# Patient Record
Sex: Male | Born: 1955 | Race: Black or African American | Hispanic: No | Smoking: Never smoker
Health system: Southern US, Community
[De-identification: ages and names within clinical notes are randomized; demographics above are authoritative.]

## PROBLEM LIST (undated history)

## (undated) DIAGNOSIS — N429 Disorder of prostate, unspecified: Secondary | ICD-10-CM

## (undated) DIAGNOSIS — E119 Type 2 diabetes mellitus without complications: Secondary | ICD-10-CM

## (undated) DIAGNOSIS — I10 Essential (primary) hypertension: Secondary | ICD-10-CM

## (undated) HISTORY — DX: Disorder of prostate, unspecified: N42.9

---

## 2010-05-13 ENCOUNTER — Emergency Department: Payer: Self-pay | Admitting: Unknown Physician Specialty

## 2012-05-04 ENCOUNTER — Emergency Department: Payer: Self-pay | Admitting: Emergency Medicine

## 2012-05-04 LAB — CBC WITH DIFFERENTIAL/PLATELET
Eosinophil %: 3.9 %
HCT: 41.4 % (ref 40.0–52.0)
HGB: 13.7 g/dL (ref 13.0–18.0)
Lymphocyte #: 2.8 10*3/uL (ref 1.0–3.6)
Lymphocyte %: 35.5 %
MCH: 30.7 pg (ref 26.0–34.0)
MCHC: 33.1 g/dL (ref 32.0–36.0)
Monocyte #: 0.6 x10 3/mm (ref 0.2–1.0)
Monocyte %: 7.7 %
Neutrophil #: 4 10*3/uL (ref 1.4–6.5)
Neutrophil %: 51.9 %
Platelet: 372 10*3/uL (ref 150–440)
RBC: 4.46 10*6/uL (ref 4.40–5.90)

## 2012-05-04 LAB — URINALYSIS, COMPLETE
Bilirubin,UR: NEGATIVE
Glucose,UR: NEGATIVE mg/dL (ref 0–75)
Ketone: NEGATIVE
Nitrite: NEGATIVE
Ph: 6 (ref 4.5–8.0)
RBC,UR: 2 /HPF (ref 0–5)
Specific Gravity: 1.003 (ref 1.003–1.030)
Squamous Epithelial: <1
WBC UR: 3 /HPF (ref 0–5)

## 2012-05-04 LAB — COMPREHENSIVE METABOLIC PANEL
Albumin: 3.9 g/dL (ref 3.4–5.0)
Alkaline Phosphatase: 109 U/L (ref 50–136)
BUN: 9 mg/dL (ref 7–18)
Bilirubin,Total: 0.2 mg/dL (ref 0.2–1.0)
Calcium, Total: 9.1 mg/dL (ref 8.5–10.1)
Chloride: 106 mmol/L (ref 98–107)
Co2: 22 mmol/L (ref 21–32)
Creatinine: 1.09 mg/dL (ref 0.60–1.30)
EGFR (African American): >60
EGFR (Non-African Amer.): >60
Osmolality: 279 (ref 275–301)
Potassium: 3.8 mmol/L (ref 3.5–5.1)
Total Protein: 8.6 g/dL — ABNORMAL HIGH (ref 6.4–8.2)

## 2012-05-05 LAB — ETHANOL: Ethanol %: 0.254 % — ABNORMAL HIGH (ref 0.000–0.080)

## 2014-06-03 IMAGING — CT CT STONE STUDY
1 of 2 series · 14 of 32 positions shown, 18 images · non-contrast
Comparison: none

REASON FOR EXAM: bilateral flank pain, hematuria
COMMENTS:

[Series 2: 3mm soft tissue · axial · 0.71mm/px · z∈[-888,-514]mm · 14 of 142 slices shown, 18 images]
[im 11/142  soft-tissue]
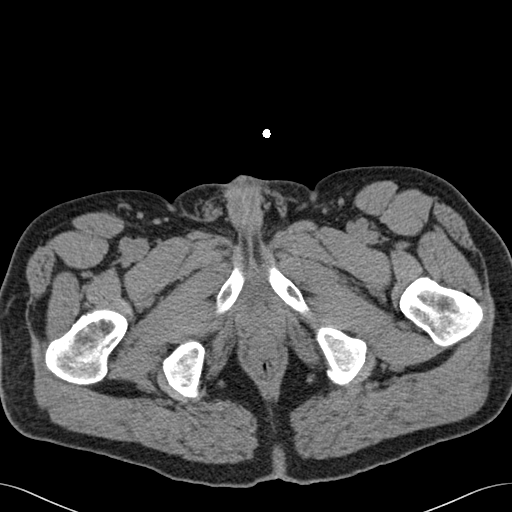
[im 11/142  bone]
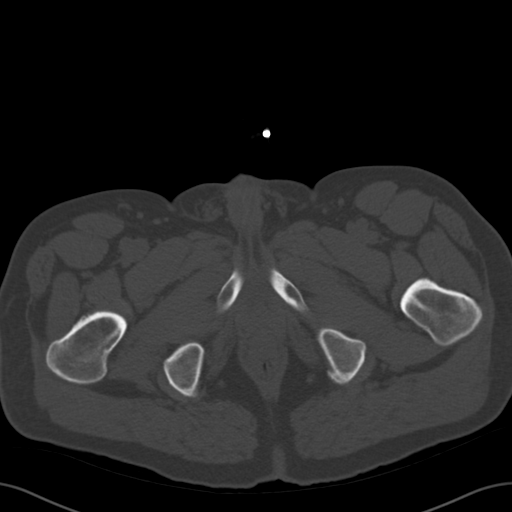
[im 22/142  soft-tissue]
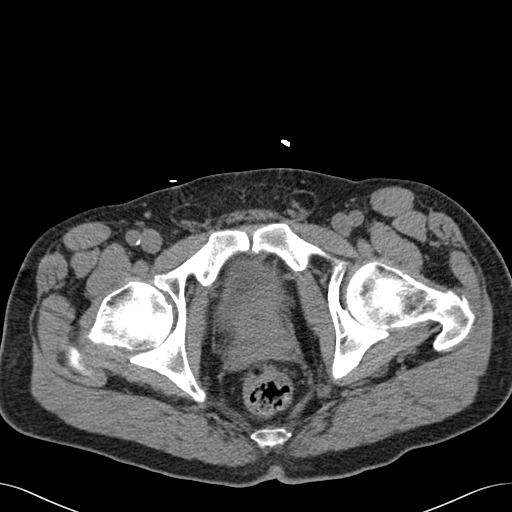
[im 33/142  soft-tissue]
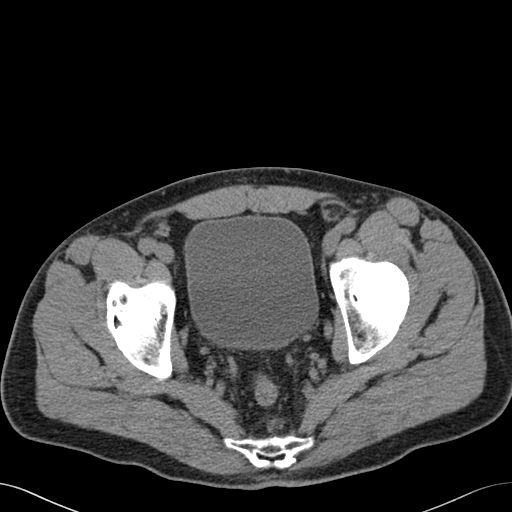
[im 44/142  soft-tissue]
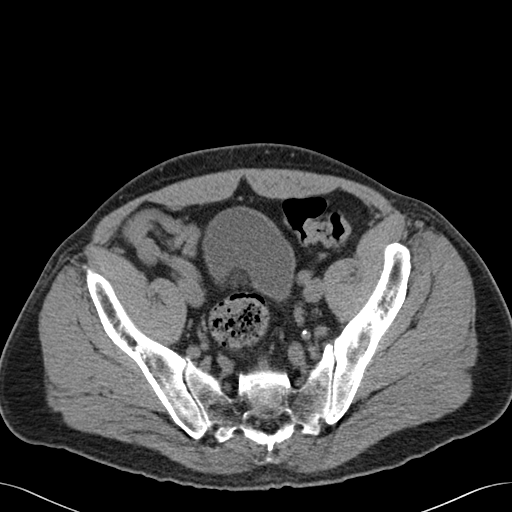
[im 55/142  soft-tissue]
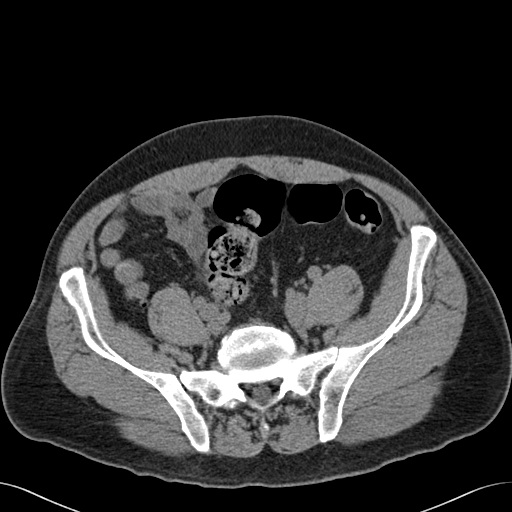
[im 66/142  soft-tissue]
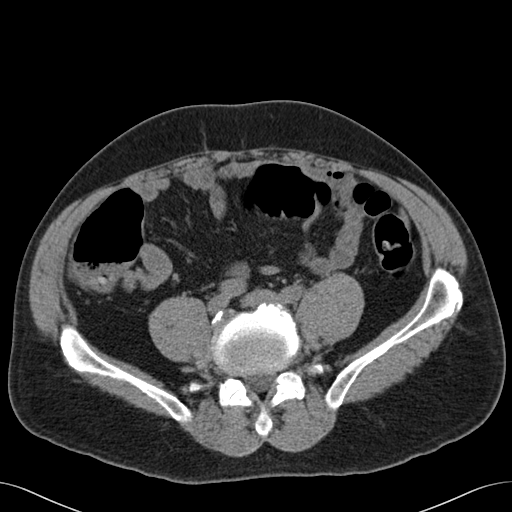
[im 76/142  soft-tissue]
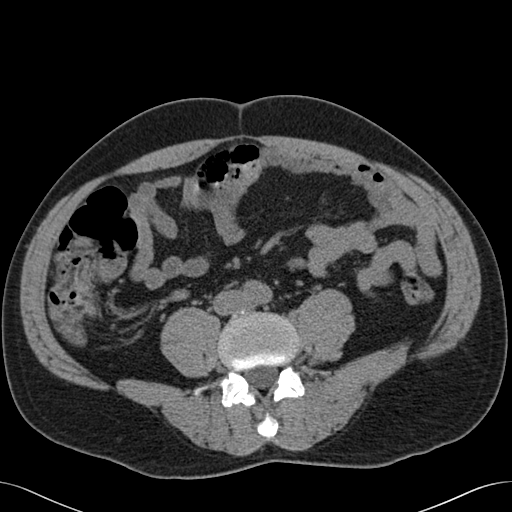
[im 87/142  soft-tissue]
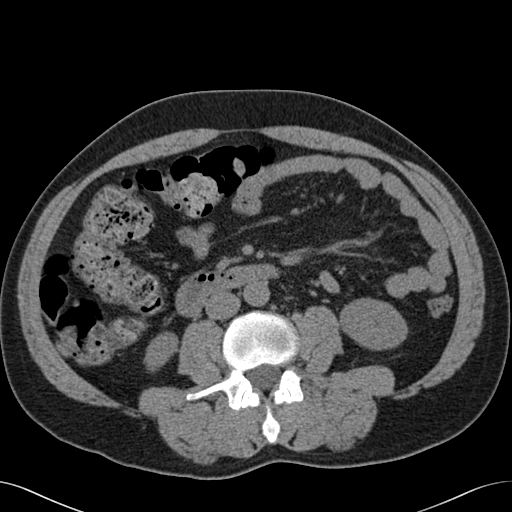
[im 98/142  soft-tissue]
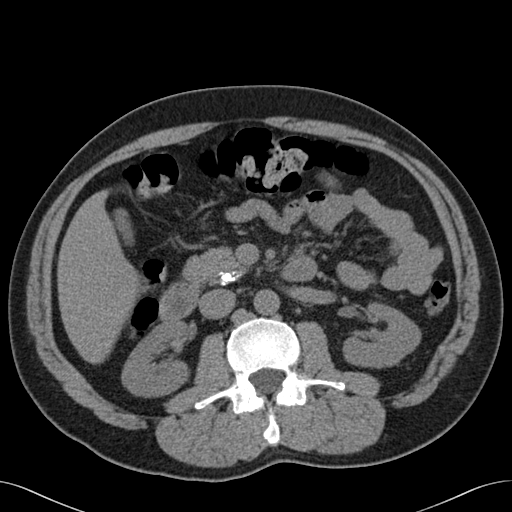
[im 98/142  bone]
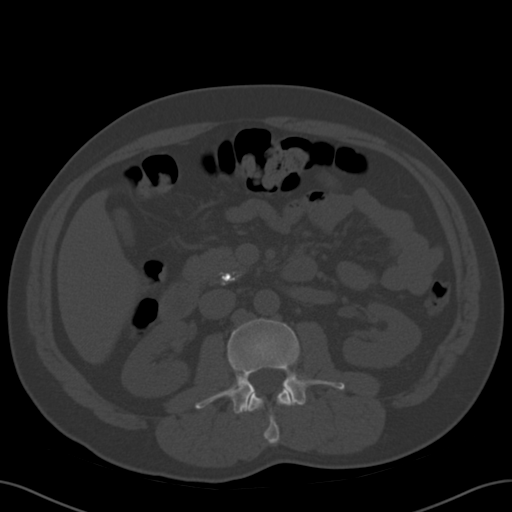
[im 109/142  soft-tissue]
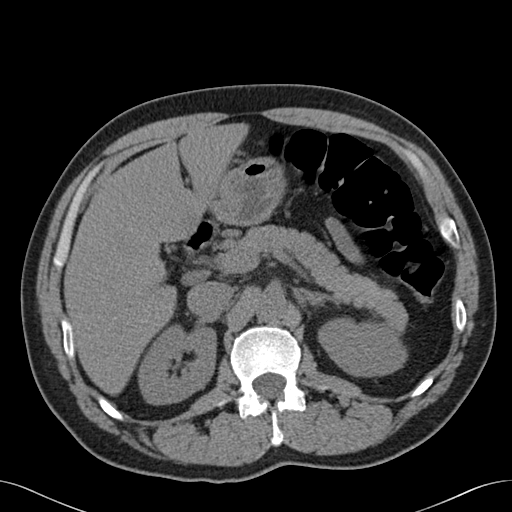
[im 120/142  soft-tissue]
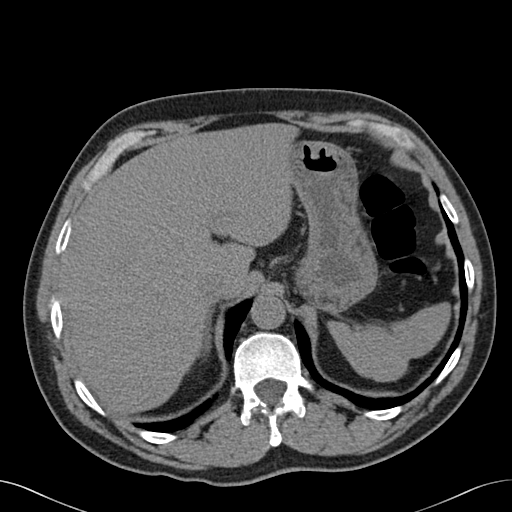
[im 120/142  lung]
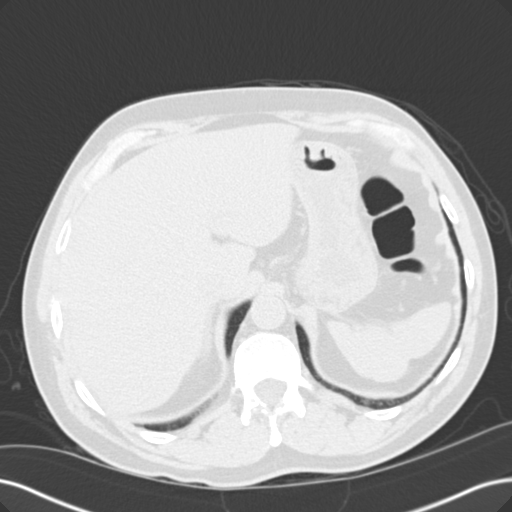
[im 125/142  lung]
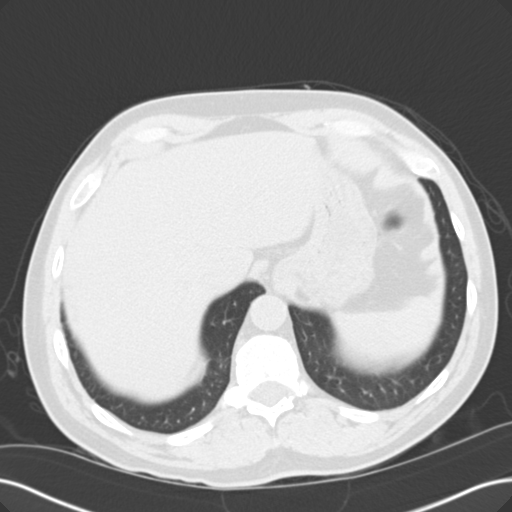
[im 131/142  soft-tissue]
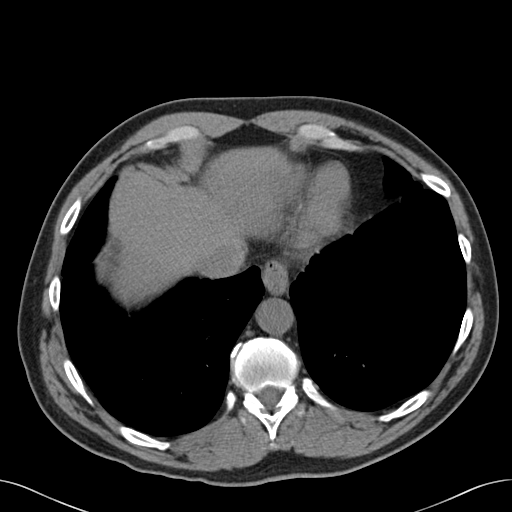
[im 131/142  lung]
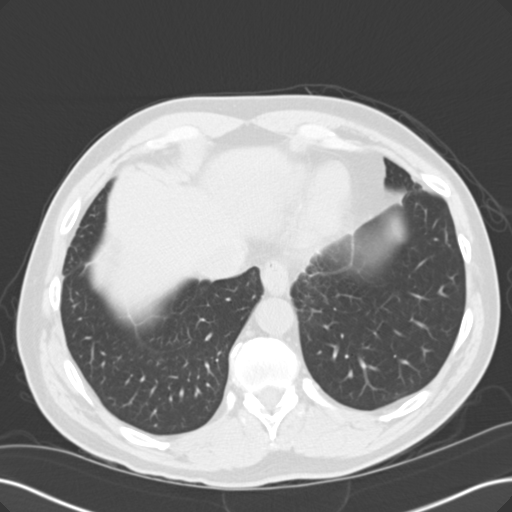
[im 136/142  lung]
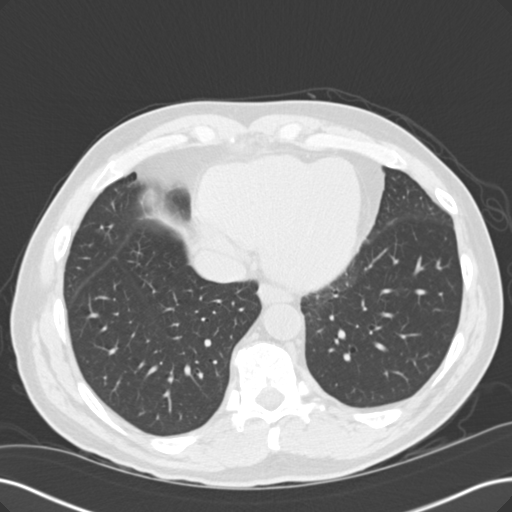

[14 of 32 positions shown; findings below may reference images not displayed]

PROCEDURE:     CT  - CT ABDOMEN /PELVIS WO (STONE)  - May 05, 2012  [DATE]

RESULT:     Axial noncontrast CT scanning was performed through the abdomen
and pelvis with reconstructions at 3 mm intervals and slice thicknesses the.
Review of multiplanar reconstructed images was performed separately on the
VIA monitor.

The kidneys are normal in density and contour. There are no calcified stones
demonstrated nor is there evidence of obstruction. The perinephric fat is
normal in density. Along the course of the ureters no stones are
demonstrated. The urinary bladder is partially distended and grossly normal.
The prostate gland is mildly enlarged and produces an impression upon the
urinary bladder base.

The unopacified loops of small and large bowel exhibit no evidence of ileus
nor obstruction nor of acute inflammation. The appendix is demonstrated and
is of normal caliber and contains air. The liver, spleen, and partially
distended stomach exhibit no acute abnormalities. There are calcifications
associated with the uncinate process of the pancreatic head which may be
related to previous episodes of pancreatitis although an underlying mass is
not excluded. The gallbladder is small but exhibits no stones. The caliber
of the abdominal aorta is normal. There is mild fullness of the lateral limb
of the right adrenal gland; otherwise the adrenals are normal in appearance.
There is no evidence of ascites. There is no inguinal nor significant
umbilical hernia.

The lung bases are clear. The lumbar vertebral bodies are preserved in
height.
IMPRESSION: 1. There is no evidence of urinary tract obstruction nor of urinary tract
stones. No inflammatory changes are noted in the perinephric fat. The
urinary bladder is grossly normal for the noncontrast study. There is an
impression made upon the urinary bladder base from the mildly enlarged
prostate gland. Followup triphasic CT scanning is recommended if the
patient's hematuria persists.
2. There is no evidence of acute hepatobiliary abnormality. The gallbladder
is contracted which may reflect the nonfasting state. There are
calcifications in the uncinate process of the pancreatic head which may be
related to previous episodes of pancreatitis but other underlying pathology
cannot be excluded. This merits further evaluation either with contrast
enhanced CT scanning or MRI. There are no findings to suggest acute
pancreatitis currently.
3. There is no acute bowel abnormality.

A preliminary report was sent to the [HOSPITAL] the conclusion
of the study.

[REDACTED]

## 2022-07-31 ENCOUNTER — Telehealth: Payer: Self-pay

## 2022-07-31 NOTE — Telephone Encounter (Signed)
Copied from CRM [id number]. Topic: Appointment Scheduling - Scheduling Inquiry for Clinic >> Jul 31, 2022 12:11 PM Franchot Heidelberg wrote: Reason for CRM: Pt says Dr. Judithann Graves is on his insurance card, he says that he needs to speak to her about scheduling a colonoscopy. He declined the next available new patient appt.  Best contact: [phone] or home [phone] (home)

## 2022-11-15 ENCOUNTER — Ambulatory Visit: Payer: Self-pay | Admitting: Internal Medicine

## 2023-01-08 ENCOUNTER — Encounter: Payer: Self-pay | Admitting: Physician Assistant

## 2023-01-08 ENCOUNTER — Ambulatory Visit (INDEPENDENT_AMBULATORY_CARE_PROVIDER_SITE_OTHER): Payer: Medicare Other | Admitting: Physician Assistant

## 2023-01-08 ENCOUNTER — Other Ambulatory Visit: Payer: Self-pay | Admitting: Physician Assistant

## 2023-01-08 VITALS — BP 152/76 | HR 91 | Temp 97.9°F | Ht 73.0 in | Wt 199.0 lb

## 2023-01-08 DIAGNOSIS — I491 Atrial premature depolarization: Secondary | ICD-10-CM | POA: Diagnosis not present

## 2023-01-08 DIAGNOSIS — Z125 Encounter for screening for malignant neoplasm of prostate: Secondary | ICD-10-CM

## 2023-01-08 DIAGNOSIS — G479 Sleep disorder, unspecified: Secondary | ICD-10-CM | POA: Diagnosis not present

## 2023-01-08 DIAGNOSIS — N401 Enlarged prostate with lower urinary tract symptoms: Secondary | ICD-10-CM | POA: Diagnosis not present

## 2023-01-08 DIAGNOSIS — I10 Essential (primary) hypertension: Secondary | ICD-10-CM | POA: Diagnosis not present

## 2023-01-08 DIAGNOSIS — Z1159 Encounter for screening for other viral diseases: Secondary | ICD-10-CM

## 2023-01-08 DIAGNOSIS — Z13220 Encounter for screening for lipoid disorders: Secondary | ICD-10-CM

## 2023-01-08 DIAGNOSIS — R0609 Other forms of dyspnea: Secondary | ICD-10-CM

## 2023-01-08 DIAGNOSIS — Z23 Encounter for immunization: Secondary | ICD-10-CM

## 2023-01-08 DIAGNOSIS — R351 Nocturia: Secondary | ICD-10-CM | POA: Diagnosis not present

## 2023-01-08 DIAGNOSIS — E1165 Type 2 diabetes mellitus with hyperglycemia: Secondary | ICD-10-CM | POA: Diagnosis not present

## 2023-01-08 DIAGNOSIS — Z114 Encounter for screening for human immunodeficiency virus [HIV]: Secondary | ICD-10-CM

## 2023-01-08 DIAGNOSIS — Z1211 Encounter for screening for malignant neoplasm of colon: Secondary | ICD-10-CM

## 2023-01-08 MED ORDER — MIRTAZAPINE 7.5 MG PO TABS
7.5000 mg | ORAL_TABLET | Freq: Every day | ORAL | 1 refills | Status: DC
Start: 2023-01-08 — End: 2023-05-13

## 2023-01-08 MED ORDER — TAMSULOSIN HCL 0.4 MG PO CAPS: 0.4000 mg | ORAL_CAPSULE | Freq: Every day | ORAL | 3 refills | Status: DC

## 2023-01-08 NOTE — Patient Instructions (Signed)
-  It was a pleasure to see you today! Please review your visit summary for helpful information -Lab results are usually available within 1-2 days and we will call once reviewed -I would encourage you to follow your care via MyChart where you can access lab results, notes, messages, and more -If you feel that we did a nice job today, please complete your after-visit survey and leave us a Google review! Your CMA today was Kieandra and your provider was Dan Waddell, PA-C, DMSc -Please return for follow-up in about 1 month  

## 2023-01-08 NOTE — Progress Notes (Signed)
Date:  01/08/2023   Name:  William Brown   DOB:  06/20/1955   MRN:  [id number]   Chief Complaint: Establish Care, referral GI (Wants a colonoscopy has never had one), and Insomnia  Insomnia Primary symptoms: fragmented sleep, difficulty falling asleep, frequent awakening.   Episode onset: X 6 months. The problem occurs nightly. The problem is unchanged. How many beverages per day that contain caffeine: 4-5.  Types of beverages you drink: soda. Nothing relieves the symptoms. Past treatments include nothing. Typical bedtime:  8-10 P.M..  How long after going to bed to you fall asleep: over an hour.   Duration of naps:  One to two hours.    William Brown is a pleasant 67 year old male with a history of uncorrected HTN and BPH who presents new to the clinic today to establish care.  He has been lost to primary care for 10 to 20 years by his estimation.  Currently takes no prescription medications.  He has never had any colon cancer screening at this time for a colonoscopy.  Last Physical: 10-20y ago Last Dental Exam: 1 month ago Last Eye Exam: coming up 01/14/23 Last CRC screen: never Last PSA: 2021 normal  Complains of DOE, also some orthopnea on occasion.   Chart review reveals eval with cardiology in 2019 for "abnormal EKG and chest pain."  EKG from that time was normal aside from occasional PAC.  The cardiologist at that time suspected his chest pain to be noncardiac in nature, he had a normal exercise stress test in December 2019 aside from mild nonspecific ST changes during exertion but overall "excellent exercise tolerance".  He was also seen by urology several times in the latter half of 2021 for hematuria and BPH, previously treated with tamsulosin.  Cystoscopy 09/23/2019 significant for "large hypervascular prostate," and he was advised to continue tamsulosin and follow-up in 6 months with urology.  He was lost to follow-up and inevitably ran out of tamsulosin.  Patient denies any recent  hematuria, though he does complain of nocturia up to 5 times per night on average.   Asks about Viagra, but we did not have time to discuss this today.   Medication list has been reviewed and updated.  Current Meds  Medication Sig  . mirtazapine (REMERON) 7.5 MG tablet Take 1 tablet (7.5 mg total) by mouth at bedtime.  . tamsulosin (FLOMAX) 0.4 MG CAPS capsule Take 1 capsule (0.4 mg total) by mouth daily.     Review of Systems  Respiratory:  Positive for shortness of breath.   Cardiovascular:  Negative for chest pain and palpitations.  Genitourinary:  Positive for frequency. Negative for hematuria.       Nocturia, ED  Psychiatric/Behavioral:  The patient has insomnia.     Patient Active Problem List   Diagnosis Date Noted  . Benign prostatic hyperplasia with nocturia 01/08/2023  . Premature atrial contractions 01/08/2023  . Dyspnea on exertion 01/08/2023  . Primary hypertension 01/08/2023  . Sleeping difficulty 01/08/2023    Not on File  Immunization History  Administered Date(s) Administered  . Fluad Trivalent(High Dose 65+) 01/08/2023  . PNEUMOCOCCAL CONJUGATE-20 01/08/2023  . Tdap 06/09/2007    History reviewed. No pertinent surgical history.  Social History   Tobacco Use  . Smoking status: Never  . Smokeless tobacco: Never  Vaping Use  . Vaping status: Never Used  Substance Use Topics  . Alcohol use: Yes    Comment: 1 beer daily  . Drug use: Never  Family History  Problem Relation Age of Onset  . Hypertension Mother   . Diabetes Mother         01/08/2023    9:32 AM  GAD 7 : Generalized Anxiety Score  Nervous, Anxious, on Edge 0  Control/stop worrying 0  Worry too much - different things 1  Trouble relaxing 1  Restless 1  Easily annoyed or irritable 0  Afraid - awful might happen 0  Total GAD 7 Score 3  Anxiety Difficulty Somewhat difficult       01/08/2023    9:32 AM  Depression screen PHQ 2/9  Decreased Interest 1  Down, Depressed,  Hopeless 2  PHQ - 2 Score 3  Altered sleeping 3  Tired, decreased energy 3  Change in appetite 1  Feeling bad or failure about yourself  1  Trouble concentrating 0  Moving slowly or fidgety/restless 1  Suicidal thoughts 0  PHQ-9 Score 12  Difficult doing work/chores Not difficult at all    BP Readings from Last 3 Encounters:  01/08/23 (!) 152/76    Wt Readings from Last 3 Encounters:  01/08/23 199 lb (90.3 kg)    BP (!) 152/76 (BP Location: Left Arm, Patient Position: Sitting, Cuff Size: Large)   Pulse 91   Temp 97.9 F (36.6 C) (Oral)   Ht 6\' 1"  (1.854 m)   Wt 199 lb (90.3 kg)   SpO2 95%   BMI 26.25 kg/m   Physical Exam Vitals and nursing note reviewed.  Constitutional:      Appearance: Normal appearance.  HENT:     Right Ear: Tympanic membrane is scarred.     Left Ear: Tympanic membrane is scarred.     Ears:     Comments: EAC clear bilaterally with good view of TM which is without effusion or erythema.     Nose: Nose normal.     Mouth/Throat:     Mouth: Mucous membranes are moist. No oral lesions.     Dentition: Abnormal dentition.     Pharynx: No posterior oropharyngeal erythema.     Comments: Missing many teeth Eyes:     Extraocular Movements: Extraocular movements intact.     Conjunctiva/sclera: Conjunctivae normal.     Pupils: Pupils are equal, round, and reactive to light.     Comments: Sclera hyperpigmented without jaundice  Neck:     Thyroid: No thyromegaly.  Cardiovascular:     Rate and Rhythm: Normal rate and regular rhythm. Frequent Extrasystoles are present.    Heart sounds: Murmur heard.     Diastolic murmur is present with a grade of 1/4.     No friction rub. No gallop.     Comments: Pulses 2+ at radial, PT, DP bilaterally. No carotid bruit. No peripheral edema. Possible diastolic murmur vs prominent S2.  Skipped/early beats in no particular pattern with regular underlying rhythm.  Pulmonary:     Effort: Pulmonary effort is normal.      Breath sounds: Normal breath sounds.  Abdominal:     General: Bowel sounds are normal.     Palpations: Abdomen is soft. There is no mass.     Tenderness: There is no abdominal tenderness.  Genitourinary:    Prostate: Enlarged. Not tender and no nodules present.     Rectum: Guaiac result negative. No tenderness.     Comments: Genital exam deferred.  DRE significant for uniformly enlarged prostate without masses or tenderness.  Musculoskeletal:     Comments: Full ROM with strength 5/5 bilateral  upper and lower extremities  Lymphadenopathy:     Cervical: No cervical adenopathy.  Skin:    General: Skin is warm.     Capillary Refill: Capillary refill takes less than 2 seconds.     Findings: No lesion or rash.  Neurological:     Mental Status: He is alert and oriented to person, place, and time.     Gait: Gait is intact.  Psychiatric:        Mood and Affect: Mood normal.        Behavior: Behavior normal.   EKG: Sinus rhythm with PAC's.  Possible RVH with strong steep QRS V3-V5 and T wave inversion in V1.  Possible early repolarization most evident in lead II and aVF.  Abnormal EKG, no prior comparison.     Recent Labs     Component Value Date/Time   NA 140 05/04/2012 2303   K 3.8 05/04/2012 2303   CL 106 05/04/2012 2303   CO2 22 05/04/2012 2303   GLUCOSE 113 (H) 05/04/2012 2303   BUN 9 05/04/2012 2303   CREATININE 1.09 05/04/2012 2303   CALCIUM 9.1 05/04/2012 2303   PROT 8.6 (H) 05/04/2012 2303   ALBUMIN 3.9 05/04/2012 2303   AST 20 05/04/2012 2303   ALT 25 05/04/2012 2303   ALKPHOS 109 05/04/2012 2303   BILITOT 0.2 05/04/2012 2303   GFRNONAA >60 05/04/2012 2303   GFRAA >60 05/04/2012 2303    Lab Results  Component Value Date   WBC 7.8 05/04/2012   HGB 13.7 05/04/2012   HCT 41.4 05/04/2012   MCV 93 05/04/2012   PLT 372 05/04/2012   No results found for: "HGBA1C" No results found for: "CHOL", "HDL", "LDLCALC", "LDLDIRECT", "TRIG", "CHOLHDL" No results found for:  "TSH"   Assessment and Plan:  1. Primary hypertension Elevated x 2 in clinic today, consistent with prior external visits in years past.  Considered starting antihypertensive today, but we will first see how he responds to daily use of tamsulosin with close follow-up within 1 month.  Check baseline labs today. - CBC with Differential/Platelet - Comprehensive metabolic panel - TSH - Lipid panel - tamsulosin (FLOMAX) 0.4 MG CAPS capsule; Take 1 capsule (0.4 mg total) by mouth daily.  Dispense: 90 capsule; Refill: 3  2. Dyspnea on exertion Given his DOE, intermittent orthopnea, abnormal EKG, and possible diastolic murmur, will proceed with echo and cardiology referral.  - ECHOCARDIOGRAM COMPLETE - Ambulatory referral to Cardiology  3. Premature atrial contractions Reviewed EKG with patient, likely PACs. Will hold off on any pharmacotherapy at this time. - EKG 12-Lead - Ambulatory referral to Cardiology  4. Sleeping difficulty Prescribing mirtazapine low-dose to assist with sleep which will likely have dual benefit for positive depression screen today.  Advised patient may increase appetite and could contribute to weight gain. - mirtazapine (REMERON) 7.5 MG tablet; Take 1 tablet (7.5 mg total) by mouth at bedtime.  Dispense: 30 tablet; Refill: 1  5. Benign prostatic hyperplasia with nocturia Refill Flomax as below, check PSA today. - PSA - tamsulosin (FLOMAX) 0.4 MG CAPS capsule; Take 1 capsule (0.4 mg total) by mouth daily.  Dispense: 90 capsule; Refill: 3  6. Screening for colon cancer Referring to GI for initial CRC screen.  Hemoccult negative today. - Ambulatory referral to Gastroenterology  7. Screening PSA (prostate specific antigen) - PSA  8. Screening for hyperlipidemia - Lipid panel  9. Screening for HIV (human immunodeficiency virus) - HIV Antibody (routine testing w rflx)  10. Need for  hepatitis C screening test - Hepatitis C antibody  11. Need for  vaccination Administered high-dose flu shot and Prevnar 20 today. - Flu Vaccine Trivalent High Dose (Fluad) - Pneumococcal conjugate vaccine 20-valent    Return in about 4 weeks (around 02/05/2023) for OV f/u chronic conditions .   Today's visit billed for estimated provider time of 80 minutes including thorough review of medical records with multiple specialists, evaluation and management of chronic concerns, independent ordering and review of labs, in-house EKG with interpretation, and medication management.  Alvester Morin, PA-C, DMSc, Nutritionist [facility] [phone]

## 2023-01-09 ENCOUNTER — Encounter: Payer: Self-pay | Admitting: Physician Assistant

## 2023-01-09 DIAGNOSIS — E8881 Metabolic syndrome: Secondary | ICD-10-CM | POA: Insufficient documentation

## 2023-01-09 DIAGNOSIS — E1165 Type 2 diabetes mellitus with hyperglycemia: Secondary | ICD-10-CM | POA: Insufficient documentation

## 2023-01-09 DIAGNOSIS — R972 Elevated prostate specific antigen [PSA]: Secondary | ICD-10-CM | POA: Insufficient documentation

## 2023-01-09 DIAGNOSIS — E1169 Type 2 diabetes mellitus with other specified complication: Secondary | ICD-10-CM | POA: Insufficient documentation

## 2023-01-09 LAB — LIPID PANEL
Chol/HDL Ratio: 4 {ratio} (ref 0.0–5.0)
Cholesterol, Total: 226 mg/dL — ABNORMAL HIGH (ref 100–199)
HDL: 57 mg/dL (ref >39)
LDL Chol Calc (NIH): 156 mg/dL — ABNORMAL HIGH (ref 0–99)
Triglycerides: 76 mg/dL (ref 0–149)
VLDL Cholesterol Cal: 13 mg/dL (ref 5–40)

## 2023-01-09 LAB — COMPREHENSIVE METABOLIC PANEL
ALT: 37 [IU]/L (ref 0–44)
AST: 37 [IU]/L (ref 0–40)
Albumin: 4.3 g/dL (ref 3.9–4.9)
Alkaline Phosphatase: 100 [IU]/L (ref 44–121)
BUN/Creatinine Ratio: 8 — ABNORMAL LOW (ref 10–24)
BUN: 10 mg/dL (ref 8–27)
Bilirubin Total: 0.7 mg/dL (ref 0.0–1.2)
CO2: 24 mmol/L (ref 20–29)
Calcium: 9.9 mg/dL (ref 8.6–10.2)
Chloride: 96 mmol/L (ref 96–106)
Creatinine, Ser: 1.18 mg/dL (ref 0.76–1.27)
Globulin, Total: 3.3 g/dL (ref 1.5–4.5)
Glucose: 240 mg/dL — ABNORMAL HIGH (ref 70–99)
Potassium: 4.6 mmol/L (ref 3.5–5.2)
Sodium: 134 mmol/L (ref 134–144)
Total Protein: 7.6 g/dL (ref 6.0–8.5)
eGFR: 68 mL/min/{1.73_m2} (ref >59)

## 2023-01-09 LAB — CBC WITH DIFFERENTIAL/PLATELET
Basophils Absolute: 0 10*3/uL (ref 0.0–0.2)
Basos: 1 %
EOS (ABSOLUTE): 0.2 10*3/uL (ref 0.0–0.4)
Eos: 3 %
Hematocrit: 43.5 % (ref 37.5–51.0)
Hemoglobin: 14 g/dL (ref 13.0–17.7)
Immature Grans (Abs): 0 10*3/uL (ref 0.0–0.1)
Immature Granulocytes: 0 %
Lymphocytes Absolute: 1.2 10*3/uL (ref 0.7–3.1)
Lymphs: 25 %
MCH: 29 pg (ref 26.6–33.0)
MCHC: 32.2 g/dL (ref 31.5–35.7)
MCV: 90 fL (ref 79–97)
Monocytes Absolute: 0.4 10*3/uL (ref 0.1–0.9)
Monocytes: 9 %
Neutrophils Absolute: 3 10*3/uL (ref 1.4–7.0)
Neutrophils: 62 %
Platelets: 406 10*3/uL (ref 150–450)
RBC: 4.82 x10E6/uL (ref 4.14–5.80)
RDW: 12.4 % (ref 11.6–15.4)
WBC: 4.8 10*3/uL (ref 3.4–10.8)

## 2023-01-09 LAB — TSH: TSH: 2.13 u[IU]/mL (ref 0.450–4.500)

## 2023-01-09 LAB — HEPATITIS C ANTIBODY: Hep C Virus Ab: NONREACTIVE

## 2023-01-09 LAB — PSA: Prostate Specific Ag, Serum: 6.8 ng/mL — ABNORMAL HIGH (ref 0.0–4.0)

## 2023-01-09 LAB — HIV ANTIBODY (ROUTINE TESTING W REFLEX): HIV Screen 4th Generation wRfx: NONREACTIVE

## 2023-01-10 ENCOUNTER — Telehealth: Payer: Self-pay | Admitting: Physician Assistant

## 2023-01-10 ENCOUNTER — Other Ambulatory Visit: Payer: Self-pay | Admitting: Physician Assistant

## 2023-01-10 DIAGNOSIS — E1165 Type 2 diabetes mellitus with hyperglycemia: Secondary | ICD-10-CM

## 2023-01-10 LAB — SPECIMEN STATUS REPORT

## 2023-01-10 LAB — HGB A1C W/O EAG: Hgb A1c MFr Bld: 12.2 % — ABNORMAL HIGH (ref 4.8–5.6)

## 2023-01-10 MED ORDER — METFORMIN HCL 500 MG PO TABS: 500.0000 mg | ORAL_TABLET | Freq: Two times a day (BID) | ORAL | 0 refills | Status: DC

## 2023-01-10 NOTE — Telephone Encounter (Signed)
Attempted call to patient to review recent labs. I would prefer to speak with him directly and would like a good time for me to call him back.   If he is adamant about knowing his lab results, he has very uncontrolled diabetes way over the upper limit of normal and will need to start medication ASAP.

## 2023-01-16 ENCOUNTER — Telehealth: Payer: Self-pay

## 2023-01-16 ENCOUNTER — Encounter: Payer: Self-pay | Admitting: *Deleted

## 2023-01-16 NOTE — Telephone Encounter (Signed)
Labs printed and mailed to patient.  KP

## 2023-02-13 ENCOUNTER — Ambulatory Visit: Payer: 59

## 2023-02-14 ENCOUNTER — Other Ambulatory Visit: Payer: Self-pay | Admitting: Physician Assistant

## 2023-02-14 DIAGNOSIS — E1165 Type 2 diabetes mellitus with hyperglycemia: Secondary | ICD-10-CM

## 2023-02-17 ENCOUNTER — Other Ambulatory Visit: Payer: Self-pay | Admitting: *Deleted

## 2023-02-17 ENCOUNTER — Telehealth: Payer: Self-pay | Admitting: *Deleted

## 2023-02-17 ENCOUNTER — Telehealth: Payer: Self-pay

## 2023-02-17 DIAGNOSIS — Z1211 Encounter for screening for malignant neoplasm of colon: Secondary | ICD-10-CM

## 2023-02-17 MED ORDER — NA SULFATE-K SULFATE-MG SULF 17.5-3.13-1.6 GM/177ML PO SOLN: 1.0000 | Freq: Once | ORAL | 0 refills | Status: AC

## 2023-02-17 NOTE — Telephone Encounter (Signed)
 Colonoscopy schedule with Dr Allegra Lai on 03/18/2023

## 2023-02-17 NOTE — Telephone Encounter (Signed)
 Gastroenterology Pre-Procedure Review  Request Date: 03/18/2023 Requesting Physician: Dr. Unk  PATIENT REVIEW QUESTIONS: The patient responded to the following health history questions as indicated:    1. Are you having any GI issues? no 2. Do you have a personal history of Polyps? no 3. Do you have a family history of Colon Cancer or Polyps? no 4. Diabetes Mellitus? yes (taking metformin ) 5. Joint replacements in the past 12 months?no 6. Major health problems in the past 3 months?no 7. Any artificial heart valves, MVP, or defibrillator?no    MEDICATIONS & ALLERGIES:    Patient reports the following regarding taking any anticoagulation/antiplatelet therapy:   Plavix, Coumadin, Eliquis, Xarelto, Lovenox, Pradaxa, Brilinta, or Effient? no Aspirin? no  Patient confirms/reports the following medications:  Current Outpatient Medications  Medication Sig Dispense Refill   metFORMIN  (GLUCOPHAGE ) 500 MG tablet Take 1 tablet (500 mg total) by mouth 2 (two) times daily with a meal. 60 tablet 0   mirtazapine  (REMERON ) 7.5 MG tablet Take 1 tablet (7.5 mg total) by mouth at bedtime. 30 tablet 1   tamsulosin  (FLOMAX ) 0.4 MG CAPS capsule Take 1 capsule (0.4 mg total) by mouth daily. 90 capsule 3   No current facility-administered medications for this visit.    Patient confirms/reports the following allergies:  Not on File  No orders of the defined types were placed in this encounter.   AUTHORIZATION INFORMATION Primary Insurance: 1D#: Group #:  Secondary Insurance: 1D#: Group #:  SCHEDULE INFORMATION: Date: 03/18/2023 Time: Location:  [facility]

## 2023-02-17 NOTE — Telephone Encounter (Signed)
Patient called in to schedule colonoscopy.

## 2023-02-19 LAB — HM DIABETES EYE EXAM

## 2023-02-28 ENCOUNTER — Telehealth: Payer: Self-pay

## 2023-02-28 NOTE — Telephone Encounter (Signed)
Message left for patient to return my call.

## 2023-02-28 NOTE — Telephone Encounter (Signed)
We received patient colonoscopy instructions return in the mail.

## 2023-03-03 ENCOUNTER — Encounter: Payer: Self-pay | Admitting: Gastroenterology

## 2023-03-04 NOTE — Telephone Encounter (Signed)
Spoken to patient and confirm the address  [address] [place], [place] [address]

## 2023-03-18 ENCOUNTER — Ambulatory Visit: Payer: 59 | Admitting: Anesthesiology

## 2023-03-18 ENCOUNTER — Encounter
Admission: RE | Disposition: A | Discharge status: Home / Self Care | Payer: Self-pay | Source: Home / Self Care | Attending: Gastroenterology

## 2023-03-18 ENCOUNTER — Ambulatory Visit
Admission: RE | Admit: 2023-03-18 | Discharge: 2023-03-18 | Disposition: A | Discharge status: Home / Self Care | Payer: 59 | Attending: Gastroenterology | Admitting: Gastroenterology

## 2023-03-18 ENCOUNTER — Encounter: Payer: Self-pay | Admitting: Gastroenterology

## 2023-03-18 ENCOUNTER — Other Ambulatory Visit: Payer: Self-pay

## 2023-03-18 DIAGNOSIS — D122 Benign neoplasm of ascending colon: Secondary | ICD-10-CM | POA: Insufficient documentation

## 2023-03-18 DIAGNOSIS — D124 Benign neoplasm of descending colon: Secondary | ICD-10-CM | POA: Insufficient documentation

## 2023-03-18 DIAGNOSIS — E119 Type 2 diabetes mellitus without complications: Secondary | ICD-10-CM | POA: Diagnosis not present

## 2023-03-18 DIAGNOSIS — D128 Benign neoplasm of rectum: Secondary | ICD-10-CM

## 2023-03-18 DIAGNOSIS — K621 Rectal polyp: Secondary | ICD-10-CM | POA: Diagnosis not present

## 2023-03-18 DIAGNOSIS — Z7984 Long term (current) use of oral hypoglycemic drugs: Secondary | ICD-10-CM | POA: Diagnosis not present

## 2023-03-18 DIAGNOSIS — Z1211 Encounter for screening for malignant neoplasm of colon: Secondary | ICD-10-CM

## 2023-03-18 DIAGNOSIS — K635 Polyp of colon: Secondary | ICD-10-CM

## 2023-03-18 DIAGNOSIS — I10 Essential (primary) hypertension: Secondary | ICD-10-CM | POA: Diagnosis not present

## 2023-03-18 HISTORY — DX: Essential (primary) hypertension: I10

## 2023-03-18 HISTORY — PX: COLONOSCOPY WITH PROPOFOL: SHX5780

## 2023-03-18 HISTORY — PX: POLYPECTOMY: SHX5525

## 2023-03-18 HISTORY — DX: Type 2 diabetes mellitus without complications: E11.9

## 2023-03-18 LAB — GLUCOSE, CAPILLARY: Glucose-Capillary: 186 mg/dL — ABNORMAL HIGH (ref 70–99)

## 2023-03-18 SURGERY — COLONOSCOPY WITH PROPOFOL
Anesthesia: General

## 2023-03-18 MED ORDER — PROPOFOL 10 MG/ML IV BOLUS
INTRAVENOUS | Status: DC | PRN
  Administered 2023-03-18: 80 mg via INTRAVENOUS

## 2023-03-18 MED ORDER — LIDOCAINE HCL (PF) 2 % IJ SOLN
INTRAMUSCULAR | Status: AC
  Filled 2023-03-18: qty 5

## 2023-03-18 MED ORDER — PROPOFOL 500 MG/50ML IV EMUL
INTRAVENOUS | Status: DC | PRN
  Administered 2023-03-18: 125 ug/kg/min via INTRAVENOUS

## 2023-03-18 MED ORDER — LIDOCAINE HCL (CARDIAC) PF 100 MG/5ML IV SOSY
PREFILLED_SYRINGE | INTRAVENOUS | Status: DC | PRN
  Administered 2023-03-18: 60 mg via INTRAVENOUS

## 2023-03-18 MED ORDER — STERILE WATER FOR IRRIGATION IR SOLN
Status: DC | PRN
  Administered 2023-03-18: 60 mL

## 2023-03-18 MED ORDER — SODIUM CHLORIDE 0.9 % IV SOLN: INTRAVENOUS | Status: DC

## 2023-03-18 MED ORDER — PROPOFOL 10 MG/ML IV BOLUS
INTRAVENOUS | Status: AC
  Filled 2023-03-18: qty 40

## 2023-03-18 MED ORDER — STERILE WATER FOR IRRIGATION IR SOLN
Status: DC | PRN
  Administered 2023-03-18: 1

## 2023-03-18 SURGICAL SUPPLY — 10 items
CLIP HMST 235XBRD CATH ROT (MISCELLANEOUS) IMPLANT
ELECT REM PT RETURN 9FT ADLT (ELECTROSURGICAL) ×1 IMPLANT
ELECTRODE REM PT RTRN 9FT ADLT (ELECTROSURGICAL) IMPLANT
GOWN CVR UNV OPN BCK APRN NK (MISCELLANEOUS) ×2 IMPLANT
KIT PRC NS LF DISP ENDO (KITS) ×1 IMPLANT
MANIFOLD NEPTUNE II (INSTRUMENTS) ×1 IMPLANT
SNARE COLD EXACTO (MISCELLANEOUS) IMPLANT
SNARE LASSO HEX 3 IN 1 (INSTRUMENTS) IMPLANT
TRAP ETRAP POLY (MISCELLANEOUS) IMPLANT
WATER STERILE IRR 250ML POUR (IV SOLUTION) ×1 IMPLANT

## 2023-03-18 NOTE — Anesthesia Postprocedure Evaluation (Signed)
Anesthesia Post Note  Patient: William Brown  Procedure(s) Performed: COLONOSCOPY WITH PROPOFOL POLYPECTOMY  Patient location during evaluation: PACU Anesthesia Type: General Level of consciousness: awake and alert Pain management: pain level controlled Vital Signs Assessment: post-procedure vital signs reviewed and stable Respiratory status: spontaneous breathing, nonlabored ventilation, respiratory function stable and patient connected to nasal cannula oxygen Cardiovascular status: blood pressure returned to baseline and stable Postop Assessment: no apparent nausea or vomiting Anesthetic complications: no   No notable events documented.   Last Vitals:  Vitals:   03/18/23 0845 03/18/23 0851  BP: 109/60 126/64  Pulse: 78 78  Resp: (!) 23 (!) 26  Temp:  36.7 C  SpO2: 97% 98%    Last Pain:  Vitals:   03/18/23 0851  TempSrc:   PainSc: 0-No pain                 Marisue Humble

## 2023-03-18 NOTE — Anesthesia Preprocedure Evaluation (Signed)
Anesthesia Evaluation  Patient identified by MRN, date of birth, ID band Patient awake    Reviewed: Allergy & Precautions, H&P , NPO status , Patient's Chart, lab work & pertinent test results  Airway Mallampati: III  TM Distance: >3 FB Neck ROM: Full    Dental no notable dental hx. (+) Poor Dentition, Loose Loose left lower central incisor, multiple missing, mostly upper right :   Pulmonary neg pulmonary ROS   Pulmonary exam normal breath sounds clear to auscultation       Cardiovascular hypertension, negative cardio ROS Normal cardiovascular exam Rhythm:Regular Rate:Normal     Neuro/Psych negative neurological ROS  negative psych ROS   GI/Hepatic negative GI ROS, Neg liver ROS,,,  Endo/Other  negative endocrine ROSdiabetes    Renal/GU negative Renal ROS  negative genitourinary   Musculoskeletal negative musculoskeletal ROS (+)    Abdominal   Peds negative pediatric ROS (+)  Hematology negative hematology ROS (+)   Anesthesia Other Findings HTN Diabetes mellitus  Reproductive/Obstetrics negative OB ROS                             Anesthesia Physical Anesthesia Plan  ASA: 2  Anesthesia Plan: General   Post-op Pain Management:    Induction: Intravenous  PONV Risk Score and Plan:   Airway Management Planned: Natural Airway and Nasal Cannula  Additional Equipment:   Intra-op Plan:   Post-operative Plan:   Informed Consent: I have reviewed the patients History and Physical, chart, labs and discussed the procedure including the risks, benefits and alternatives for the proposed anesthesia with the patient or authorized representative who has indicated his/her understanding and acceptance.     Dental Advisory Given  Plan Discussed with: Anesthesiologist, CRNA and Surgeon  Anesthesia Plan Comments: (Patient consented for risks of anesthesia including but not limited to:  -  adverse reactions to medications - risk of airway placement if required - damage to eyes, teeth, lips or other oral mucosa - nerve damage due to positioning  - sore throat or hoarseness - Damage to heart, brain, nerves, lungs, other parts of body or loss of life  Patient voiced understanding and assent.)       Anesthesia Quick Evaluation

## 2023-03-18 NOTE — H&P (Signed)
  William Repress, MD [address]  [address]  [place], [place] [address]  Main: [phone]  Fax: [phone] Pager: [phone]  Primary Care Physician:  Remo Lipps, PA Primary Gastroenterologist:  Dr. Arlyss Brown  Pre-Procedure History & Physical: HPI:  William Brown is a 68 y.o. male is here for an colonoscopy.   Past Medical History:  Diagnosis Date   Diabetes mellitus without complication (HCC)    Hypertension    Prostate disorder     History reviewed. No pertinent surgical history.  Prior to Admission medications   Medication Sig Start Date End Date Taking? Authorizing Provider  metFORMIN (GLUCOPHAGE) 500 MG tablet Take 1 tablet (500 mg total) by mouth 2 (two) times daily with a meal. 01/10/23  Yes Waddell, Daniel P, PA  mirtazapine (REMERON) 7.5 MG tablet Take 1 tablet (7.5 mg total) by mouth at bedtime. Patient not taking: Reported on 03/03/2023 01/08/23   Remo Lipps, PA  tamsulosin (FLOMAX) 0.4 MG CAPS capsule Take 1 capsule (0.4 mg total) by mouth daily. Patient not taking: Reported on 03/03/2023 01/08/23   Remo Lipps, PA    Allergies as of 02/17/2023   (Not on File)    Family History  Problem Relation Age of Onset   Hypertension Mother    Diabetes Mother     Social History   Socioeconomic History   Marital status: Legally Separated    Spouse name: Not on file   Number of children: 5   Years of education: Not on file   Highest education level: Not on file  Occupational History   Not on file  Tobacco Use   Smoking status: Never   Smokeless tobacco: Never  Vaping Use   Vaping status: Never Used  Substance and Sexual Activity   Alcohol use: Yes    Comment: 1 beer daily   Drug use: Never   Sexual activity: Not Currently  Other Topics Concern   Not on file  Social History Narrative   Not on file   Social Drivers of Health   Financial Resource Strain: Not on file  Food Insecurity: Not on file  Transportation Needs:  Not on file  Physical Activity: Not on file  Stress: Not on file  Social Connections: Not on file  Intimate Partner Violence: Not on file    Review of Systems: See HPI, otherwise negative ROS  Physical Exam: BP (!) 144/77   Temp 98.1 F (36.7 C) (Temporal)   Resp (!) 23   Ht 6' 0.99" (1.854 m)   Wt 92.2 kg   SpO2 97%   BMI 26.83 kg/m  General:   Alert,  pleasant and cooperative in NAD Head:  Normocephalic and atraumatic. Neck:  Supple; no masses or thyromegaly. Lungs:  Clear throughout to auscultation.    Heart:  Regular rate and rhythm. Abdomen:  Soft, nontender and nondistended. Normal bowel sounds, without guarding, and without rebound.   Neurologic:  Alert and  oriented x4;  grossly normal neurologically.  Impression/Plan: William Brown is here for an colonoscopy to be performed for colon cancer screening  Risks, benefits, limitations, and alternatives regarding  colonoscopy have been reviewed with the patient.  Questions have been answered.  All parties agreeable.   Lannette Donath, MD  03/18/2023, 7:35 AM

## 2023-03-18 NOTE — Op Note (Signed)
[facility] Gastroenterology Patient Name: William Brown Procedure Date: 03/18/2023 8:06 AM MRN: [id number] Account #: [id number] Date of Birth: November 03, 1955 Admit Type: Outpatient Age: 68 Room: [facility] OR ROOM 01 Gender: Male Note Status: Finalized Instrument Name: [id number] Procedure:             Colonoscopy Indications:           Screening for colorectal malignant neoplasm, This is                         the patient's first colonoscopy Providers:             Toney Reil MD, MD Referring MD:          Toney Reil MD, MD (Referring MD), Melton Alar.                         Mordecai Maes (Referring MD) Medicines:             General Anesthesia Complications:         No immediate complications. Estimated blood loss: None. Procedure:             Pre-Anesthesia Assessment:                        - Prior to the procedure, a History and Physical was                         performed, and patient medications and allergies were                         reviewed. The patient is competent. The risks and                         benefits of the procedure and the sedation options and                         risks were discussed with the patient. All questions                         were answered and informed consent was obtained.                         Patient identification and proposed procedure were                         verified by the physician, the nurse, the                         anesthesiologist, the anesthetist and the technician                         in the pre-procedure area in the procedure room in the                         endoscopy suite. Mental Status Examination: alert and                         oriented. Airway Examination: normal oropharyngeal  airway and neck mobility. Respiratory Examination:                         clear to auscultation. CV Examination: normal.                         Prophylactic Antibiotics: The patient  does not require                         prophylactic antibiotics. Prior Anticoagulants: The                         patient has taken no anticoagulant or antiplatelet                         agents. ASA Grade Assessment: II - A patient with mild                         systemic disease. After reviewing the risks and                         benefits, the patient was deemed in satisfactory                         condition to undergo the procedure. The anesthesia                         plan was to use general anesthesia. Immediately prior                         to administration of medications, the patient was                         re-assessed for adequacy to receive sedatives. The                         heart rate, respiratory rate, oxygen saturations,                         blood pressure, adequacy of pulmonary ventilation, and                         response to care were monitored throughout the                         procedure. The physical status of the patient was                         re-assessed after the procedure.                        After obtaining informed consent, the colonoscope was                         passed under direct vision. Throughout the procedure,                         the patient's blood pressure, pulse, and oxygen  saturations were monitored continuously. The                         Colonoscope was introduced through the anus and                         advanced to the the cecum, identified by appendiceal                         orifice and ileocecal valve. The colonoscopy was                         performed without difficulty. The patient tolerated                         the procedure well. The quality of the bowel                         preparation was evaluated using the BBPS [facility] Bowel                         Preparation Scale) with scores of: Right Colon = 3,                         Transverse Colon = 3 and Left  Colon = 3 (entire mucosa                         seen well with no residual staining, small fragments                         of stool or opaque liquid). The total BBPS score                         equals 9. The ileocecal valve, appendiceal orifice,                         and rectum were photographed. Findings:      The perianal and digital rectal examinations were normal. Pertinent       negatives include normal sphincter tone and no palpable rectal lesions.      An 8 mm polyp was found in the ascending colon. The polyp was sessile.       The polyp was removed with a cold snare. Resection and retrieval were       complete. To prevent bleeding after the polypectomy, one hemostatic clip       was successfully placed (MR safe). Clip manufacturer: [employer].       There was no bleeding at the end of the procedure. Estimated blood loss:       none.      A 12 mm polyp was found in the descending colon. The polyp was       pedunculated. The polyp was removed with a hot snare. Resection and       retrieval were complete. Estimated blood loss: none.      A 3 mm polyp was found in the rectum. The polyp was sessile. The polyp       was removed with a cold snare. Resection and retrieval were complete.  The retroflexed view of the distal rectum and anal verge was normal and       showed no anal or rectal abnormalities. Impression:            - One 8 mm polyp in the ascending colon, removed with                         a cold snare. Resected and retrieved. Clip                         manufacturer: [employer]. Clip (MR safe) was                         placed.                        - One 12 mm polyp in the descending colon, removed                         with a hot snare. Resected and retrieved.                        - One 3 mm polyp in the rectum, removed with a cold                         snare. Resected and retrieved.                        - The distal rectum and anal  verge are normal on                         retroflexion view. Recommendation:        - Discharge patient to home (with spouse).                        - Resume previous diet today.                        - Continue present medications.                        - Await pathology results.                        - Repeat colonoscopy in 3 years for surveillance of                         multiple polyps. Procedure Code(s):     --- Professional ---                        [phone], Colonoscopy, flexible; with removal of                         tumor(s), polyp(s), or other lesion(s) by snare                         technique Diagnosis Code(s):     --- Professional ---  Z12.11, Encounter for screening for malignant neoplasm                         of colon                        D12.2, Benign neoplasm of ascending colon                        D12.4, Benign neoplasm of descending colon                        D12.8, Benign neoplasm of rectum CPT copyright 2022 American Medical Association. All rights reserved. The codes documented in this report are preliminary and upon coder review may  be revised to meet current compliance requirements. Dr. Libby Maw Toney Reil MD, MD 03/18/2023 8:38:09 AM This report has been signed electronically. Number of Addenda: 0 Note Initiated On: 03/18/2023 8:06 AM Scope Withdrawal Time: 0 hours 14 minutes 55 seconds  Total Procedure Duration: 0 hours 18 minutes 2 seconds  Estimated Blood Loss:  Estimated blood loss: none.      [facility]

## 2023-03-18 NOTE — Transfer of Care (Signed)
Immediate Anesthesia Transfer of Care Note  Patient: William Brown  Procedure(s) Performed: COLONOSCOPY WITH PROPOFOL POLYPECTOMY  Patient Location: PACU  Anesthesia Type: General  Level of Consciousness: awake, alert  and patient cooperative  Airway and Oxygen Therapy: Patient Spontanous Breathing and Patient connected to supplemental oxygen  Post-op Assessment: Post-op Vital signs reviewed, Patient's Cardiovascular Status Stable, Respiratory Function Stable, Patent Airway and No signs of Nausea or vomiting  Post-op Vital Signs: Reviewed and stable  Complications: No notable events documented.

## 2023-03-19 ENCOUNTER — Encounter: Payer: Self-pay | Admitting: Gastroenterology

## 2023-03-19 LAB — SURGICAL PATHOLOGY

## 2023-03-20 ENCOUNTER — Encounter: Payer: Self-pay | Admitting: Gastroenterology

## 2023-03-26 ENCOUNTER — Other Ambulatory Visit: Payer: Self-pay

## 2023-03-26 ENCOUNTER — Telehealth: Payer: Self-pay

## 2023-03-26 DIAGNOSIS — E1165 Type 2 diabetes mellitus with hyperglycemia: Secondary | ICD-10-CM

## 2023-03-26 MED ORDER — METFORMIN HCL 500 MG PO TABS: 500.0000 mg | ORAL_TABLET | Freq: Two times a day (BID) | ORAL | 0 refills | Status: DC

## 2023-03-26 NOTE — Telephone Encounter (Signed)
 Patient was identified as falling into the True North Measure - Diabetes.   Patient was: Appointment scheduled for lab or office visit for A1c.

## 2023-03-26 NOTE — Telephone Encounter (Signed)
-----   Message from Amy P sent at 03/25/2023  9:35 AM EST ----- Regarding: A1C Please update & do a telephone encounter for this pt like listed below.  You abstracted info but his last A1C was done on 12/4 so his next needs to be after 3/4.  He needs an appt & you need to do the telephone encounter.  Please let me know when complete.  2025 True North Metric-[employer] Hemoglobin A1C Control-Black & African Americans with Diabetes A1C > 8%  This patient is on the weekly report needing the gap closed for their A1C.  Please follow the A1C Workflow to address if the pt needs an appointment.  After outreach is done please use dot phrase TNMDM in your telephone encounter to complete action taken for each pt.  You will need to respond to me with  "Yes" or "No" of contact/appt/A1C reported so that I can update the master spreadsheet.

## 2023-04-07 ENCOUNTER — Encounter: Payer: Self-pay | Admitting: Gastroenterology

## 2023-04-16 ENCOUNTER — Other Ambulatory Visit: Payer: Self-pay | Admitting: Physician Assistant

## 2023-04-16 DIAGNOSIS — E1165 Type 2 diabetes mellitus with hyperglycemia: Secondary | ICD-10-CM

## 2023-04-17 NOTE — Telephone Encounter (Signed)
 Requested Prescriptions  Pending Prescriptions Disp Refills   metFORMIN (GLUCOPHAGE) 500 MG tablet [Pharmacy Med Name: METFORMIN 500MG  TABLETS] 180 tablet 0    Sig: TAKE 1 TABLET(500 MG) BY MOUTH TWICE DAILY WITH A MEAL     Endocrinology:  Diabetes - Biguanides Failed - 04/17/2023  8:16 AM      Failed - HBA1C is between 0 and 7.9 and within 180 days    Hgb A1c MFr Bld  Date Value Ref Range Status  01/08/2023 12.2 (H) 4.8 - 5.6 % Final    Comment:             Prediabetes: 5.7 - 6.4          Diabetes: >6.4          Glycemic control for adults with diabetes: <7.0          Failed - B12 Level in normal range and within 720 days    No results found for: "VITAMINB12"       Passed - Cr in normal range and within 360 days    Creatinine  Date Value Ref Range Status  05/04/2012 1.09 0.60 - 1.30 mg/dL Final   Creatinine, Ser  Date Value Ref Range Status  01/08/2023 1.18 0.76 - 1.27 mg/dL Final         Passed - eGFR in normal range and within 360 days    EGFR (African American)  Date Value Ref Range Status  05/04/2012 >60  Final   EGFR (Non-African Amer.)  Date Value Ref Range Status  05/04/2012 >60  Final    Comment:    eGFR values <69mL/min/1.73 m2 may be an indication of chronic kidney disease (CKD). Calculated eGFR is useful in patients with stable renal function. The eGFR calculation will not be reliable in acutely ill patients when serum creatinine is changing rapidly. It is not useful in  patients on dialysis. The eGFR calculation may not be applicable to patients at the low and high extremes of body sizes, pregnant women, and vegetarians.    eGFR  Date Value Ref Range Status  01/08/2023 68 >59 mL/min/1.73 Final         Passed - Valid encounter within last 6 months    Recent Outpatient Visits           3 months ago Primary hypertension   [facility], Melton Alar, [place]       Future Appointments              In 3 weeks Mordecai Maes, Mel[place] Alar, [place] [facility]            Passed - CBC within normal limits and completed in the last 12 months    WBC  Date Value Ref Range Status  01/08/2023 4.8 3.4 - 10.8 x10E3/uL Final  05/04/2012 7.8 3.8 - 10.6 x10 3/mm 3 Final   RBC  Date Value Ref Range Status  01/08/2023 4.82 4.14 - 5.80 x10E6/uL Final  05/04/2012 4.46 4.40 - 5.90 x10 6/mm 3 Final   Hemoglobin  Date Value Ref Range Status  01/08/2023 14.0 13.0 - 17.7 g/dL Final   Hematocrit  Date Value Ref Range Status  01/08/2023 43.5 37.5 - 51.0 % Final   MCHC  Date Value Ref Range Status  01/08/2023 32.2 31.5 - 35.7 g/dL Final  09/81/1914 78.2 32.0 - 36.0 g/dL Final   [facility]  Date Value  Ref Range Status  01/08/2023 29.0 26.6 - 33.0 pg Final  05/04/2012 30.7 26.0 - 34.0 pg Final   MCV  Date Value Ref Range Status  01/08/2023 90 79 - 97 fL Final  05/04/2012 93 80 - 100 fL Final   No results found for: "PLTCOUNTKUC", "LABPLAT", "POCPLA" RDW  Date Value Ref Range Status  01/08/2023 12.4 11.6 - 15.4 % Final  05/04/2012 14.7 (H) 11.5 - 14.5 % Final

## 2023-05-09 ENCOUNTER — Other Ambulatory Visit: Payer: Self-pay | Admitting: Pharmacist

## 2023-05-09 DIAGNOSIS — E1165 Type 2 diabetes mellitus with hyperglycemia: Secondary | ICD-10-CM

## 2023-05-09 MED ORDER — ACCU-CHEK GUIDE TEST VI STRP: ORAL_STRIP | 3 refills | Status: AC

## 2023-05-09 MED ORDER — ACCU-CHEK GUIDE ME W/DEVICE KIT: PACK | 0 refills | Status: AC

## 2023-05-09 MED ORDER — ACCU-CHEK SOFTCLIX LANCETS MISC
3 refills | Status: AC
Start: 2023-05-09 — End: ?

## 2023-05-09 NOTE — Progress Notes (Signed)
 05/09/2023 Name: William Brown MRN: [id number] DOB: 10/22/1955  Chief Complaint  Patient presents with   Medication Management    William Brown is a 68 y.o. year old male who presented for a telephone visit.   Patient appearing on report for True [place] Metric - Diabetes Control report due to last documented A1C of 12.2% on 01/08/2023. Next appointment with PCP is 05/13/2023    Outreached patient to discuss diabetes control and medication management.    Subjective:  Care Team: Primary Care Provider: Remo Lipps, PA ; Next Scheduled Visit: 05/13/2023  Medication Access/Adherence  Current Pharmacy:  [id number] - [place], [place] - [address] [place] [address] Phone: [phone] Fax: [phone]   Patient reports affordability concerns with their medications: No  Patient reports access/transportation concerns to their pharmacy: No  Patient reports adherence concerns with their medications:  No     Diabetes:  Current medications: metformin 500 mg twice daily - reports tolerating well  Denies checking home blood sugar readings as does not have a glucometer  Current meal patterns:  - Breakfast: cereal - Lunch: typically skips  - Supper: Fried chicken and pinto beans or cabbage - Snacks: Oreo cookies or honey bun - Drinks: Regular Sprite and Pepsi and water  Current physical activity: stays active throughout the day walking at part-time job and home  Statin therapy: none  Objective:  Lab Results  Component Value Date   HGBA1C 12.2 (H) 01/08/2023    Lab Results  Component Value Date   CREATININE 1.18 01/08/2023   BUN 10 01/08/2023   NA 134 01/08/2023   K 4.6 01/08/2023   CL 96 01/08/2023   CO2 24 01/08/2023    Lab Results  Component Value Date   CHOL 226 (H) 01/08/2023   HDL 57 01/08/2023   LDLCALC 156 (H) 01/08/2023   TRIG 76 01/08/2023   CHOLHDL 4.0 01/08/2023      Current Outpatient Medications on File Prior to Visit  Medication Sig Dispense Refill   metFORMIN (GLUCOPHAGE) 500 MG tablet TAKE 1 TABLET(500 MG) BY MOUTH TWICE DAILY WITH A MEAL 180 tablet 0   mirtazapine (REMERON) 7.5 MG tablet Take 1 tablet (7.5 mg total) by mouth at bedtime. (Patient not taking: Reported on 03/03/2023) 30 tablet 1   tamsulosin (FLOMAX) 0.4 MG CAPS capsule Take 1 capsule (0.4 mg total) by mouth daily. (Patient not taking: Reported on 03/03/2023) 90 capsule 3   No current facility-administered medications on file prior to visit.     Assessment/Plan:   Diabetes: - Currently uncontrolled - Reviewed long term cardiovascular and renal outcomes of uncontrolled blood sugar - Reviewed goal A1c, goal fasting, and goal 2 hour post prandial glucose - Reviewed dietary modifications including importance of having regular well-balanced meals and snacks throughout the day, while controlling carbohydrate portion sizes             Encourage patient to avoid consumption of sugary beverages, drink water instead             Encourage patient to increase consumption of non-starchy vegetables - Send prescription for blood sugar monitor and supplies to pharmacy for patient (per protocol) - Encourage patient to review "how to use" video for Accu-Check Guide meter.  Patient states he will review this video with help of his cousin, who has internet access, before going to pharmacy to pick up these prescriptions -  Recommend to check glucose, keep log of results and have this record to review at upcoming medical appointments. Patient to contact provider office sooner if needed for readings outside of established parameters or symptoms - Will ask PCP to consider increasing patient's metformin dose to have him taper up to metformin 1000 mg twice daily - Will ask PCP to consider starting patient on statin therapy for ASCVD risk reduction   Follow Up Plan: Clinical Pharmacist will follow up  with patient by telephone on 06/09/2023 at 1:30 PM   Estelle Grumbles, PharmD, [facility] [phone]

## 2023-05-09 NOTE — Patient Instructions (Addendum)
 Goals Addressed             This Visit's Progress    Pharmacy Goals       The goal A1c is less than 7%. This is the best way to reduce the risk of the long term complications of diabetes, including heart disease, kidney disease, eye disease, strokes, and nerve damage. An A1c of less than 7% corresponds with fasting sugars less than 130 and 2 hour after meal sugars less than 180.   Estelle Grumbles, PharmD, [facility] [phone]

## 2023-05-13 ENCOUNTER — Ambulatory Visit (INDEPENDENT_AMBULATORY_CARE_PROVIDER_SITE_OTHER): Payer: 59 | Admitting: Physician Assistant

## 2023-05-13 ENCOUNTER — Encounter: Payer: Self-pay | Admitting: Physician Assistant

## 2023-05-13 VITALS — BP 140/72 | HR 68 | Temp 97.8°F | Ht 73.0 in | Wt 200.0 lb

## 2023-05-13 DIAGNOSIS — R972 Elevated prostate specific antigen [PSA]: Secondary | ICD-10-CM

## 2023-05-13 DIAGNOSIS — N401 Enlarged prostate with lower urinary tract symptoms: Secondary | ICD-10-CM

## 2023-05-13 DIAGNOSIS — G479 Sleep disorder, unspecified: Secondary | ICD-10-CM | POA: Diagnosis not present

## 2023-05-13 DIAGNOSIS — E1165 Type 2 diabetes mellitus with hyperglycemia: Secondary | ICD-10-CM | POA: Diagnosis not present

## 2023-05-13 DIAGNOSIS — Z7984 Long term (current) use of oral hypoglycemic drugs: Secondary | ICD-10-CM | POA: Insufficient documentation

## 2023-05-13 DIAGNOSIS — E1169 Type 2 diabetes mellitus with other specified complication: Secondary | ICD-10-CM

## 2023-05-13 DIAGNOSIS — R351 Nocturia: Secondary | ICD-10-CM

## 2023-05-13 DIAGNOSIS — N529 Male erectile dysfunction, unspecified: Secondary | ICD-10-CM | POA: Insufficient documentation

## 2023-05-13 DIAGNOSIS — E785 Hyperlipidemia, unspecified: Secondary | ICD-10-CM

## 2023-05-13 LAB — POCT GLYCOSYLATED HEMOGLOBIN (HGB A1C): Hemoglobin A1C: 9.7 % — AB (ref 4.0–5.6)

## 2023-05-13 MED ORDER — MIRTAZAPINE 7.5 MG PO TABS: 7.5000 mg | ORAL_TABLET | Freq: Every day | ORAL | 1 refills | Status: DC

## 2023-05-13 MED ORDER — METFORMIN HCL 850 MG PO TABS: 850.0000 mg | ORAL_TABLET | Freq: Two times a day (BID) | ORAL | 1 refills | Status: DC

## 2023-05-13 MED ORDER — SILDENAFIL CITRATE 50 MG PO TABS: 25.0000 mg | ORAL_TABLET | Freq: Every day | ORAL | 0 refills | Status: AC | PRN

## 2023-05-13 NOTE — Assessment & Plan Note (Signed)
 Discussed conservative initiation of sildenafil especially with concurrent use of tamsulosin.  Cautioned on first dose hypotension.  Patient verbalizes understanding.

## 2023-05-13 NOTE — Progress Notes (Signed)
 Date:  05/13/2023   Name:  William Brown   DOB:  17-Oct-1955   MRN:  [id number]   Chief Complaint: Diabetes  HPI William Brown returns to the clinic overdue for follow-up chronic conditions, namely new diagnosis of DM2 with last A1c 12.2% in December, due for repeat today.  Unfortunately we have been unable to contact him after his last visit to even discuss the diagnosis of diabetes, which was only just brought to his attention last week when he was contacted by a pharmacist with the [facility] population health department due to failing metrics.    Interestingly, he has been taking metformin for the last several months without knowing what it was for.  Thankfully the pharmacist discussed the diagnosis with him last week, prescribed a glucometer, and educated on nutrition to some degree. Previously drank 3-4 sodas per day but has quit. Alcohol estimated at 1 beer per day. Glucose at home mostly <180 which was the initial goal set for him. Had eye exam at [facility] a couple months ago.   Overall doing well. Has had borderline HTN in clinic previously, but not currently using any antihypertensives as we chose to see how he responded to tamsulosin first. PSA was slightly elevated last time at 6.8, prostate enlarged on exam, due for repeat PSA today.  HLD noted last visit with LDL 156.  Statin recommended in the context of DM2.  Was having trouble sleeping with some depressive symptoms last visit, says mirtazapine helped with this but he ran out.  Medication list has been reviewed and updated.  Current Meds  Medication Sig   Accu-Chek Softclix Lancets lancets Use to check blood sugar up to twice daily   Blood Glucose Monitoring Suppl (ACCU-CHEK GUIDE ME) w/Device KIT Use to check blood sugar up to twice daily   glucose blood (ACCU-CHEK GUIDE TEST) test strip Use to check blood sugar up to twice daily   sildenafil (VIAGRA) 50 MG tablet Take 0.5-1 tablets (25-50 mg total) by mouth daily as  needed for erectile dysfunction. Do not take more than 1 tablet within 24h.   tamsulosin (FLOMAX) 0.4 MG CAPS capsule Take 1 capsule (0.4 mg total) by mouth daily.   [DISCONTINUED] metFORMIN (GLUCOPHAGE) 500 MG tablet TAKE 1 TABLET(500 MG) BY MOUTH TWICE DAILY WITH A MEAL     Review of Systems  Patient Active Problem List   Diagnosis Date Noted   Erectile dysfunction 05/13/2023   Long term current use of oral hypoglycemic drug 05/13/2023   Screening for colon cancer 03/18/2023   Polyp of descending colon 03/18/2023   Polyp of ascending colon 03/18/2023   Elevated PSA, less than 10 ng/ml 01/09/2023   Hyperlipidemia associated with type 2 diabetes mellitus (HCC) 01/09/2023   Type 2 diabetes mellitus with hyperglycemia (HCC) 01/09/2023   Benign prostatic hyperplasia with nocturia 01/08/2023   Premature atrial contractions 01/08/2023   Dyspnea on exertion 01/08/2023   Primary hypertension 01/08/2023   Sleeping difficulty 01/08/2023    No Known Allergies  Immunization History  Administered Date(s) Administered   Fluad Trivalent(High Dose 65+) 01/08/2023   PNEUMOCOCCAL CONJUGATE-20 01/08/2023   Tdap 06/09/2007    Past Surgical History:  Procedure Laterality Date   COLONOSCOPY WITH PROPOFOL N/A 03/18/2023   Procedure: COLONOSCOPY WITH PROPOFOL;  Surgeon: Toney Reil, MD;  Location: [facility];  Service: Endoscopy;  Laterality: N/A;   POLYPECTOMY N/A 03/18/2023   Procedure: POLYPECTOMY;  Surgeon: Toney Reil, MD;  Location: [facility];  Service: Endoscopy;  Laterality: N/A;    Social History   Tobacco Use   Smoking status: Never   Smokeless tobacco: Never  Vaping Use   Vaping status: Never Used  Substance Use Topics   Alcohol use: Yes    Comment: 1 beer daily   Drug use: Never    Family History  Problem Relation Age of Onset   Hypertension Mother    Diabetes Mother         05/13/2023    8:12 AM 01/08/2023    9:32 AM  GAD 7 :  Generalized Anxiety Score  Nervous, Anxious, on Edge 0 0  Control/stop worrying 0 0  Worry too much - different things 0 1  Trouble relaxing 0 1  Restless 0 1  Easily annoyed or irritable 0 0  Afraid - awful might happen 0 0  Total GAD 7 Score 0 3  Anxiety Difficulty Not difficult at all Somewhat difficult       05/13/2023    8:12 AM 01/08/2023    9:32 AM  Depression screen PHQ 2/9  Decreased Interest 0 1  Down, Depressed, Hopeless 0 2  PHQ - 2 Score 0 3  Altered sleeping  3  Tired, decreased energy  3  Change in appetite  1  Feeling bad or failure about yourself   1  Trouble concentrating  0  Moving slowly or fidgety/restless  1  Suicidal thoughts  0  PHQ-9 Score  12  Difficult doing work/chores  Not difficult at all    BP Readings from Last 3 Encounters:  05/13/23 (!) 140/72  03/18/23 126/64  01/08/23 (!) 152/76    Wt Readings from Last 3 Encounters:  05/13/23 200 lb (90.7 kg)  03/18/23 203 lb 4.8 oz (92.2 kg)  01/08/23 199 lb (90.3 kg)    BP (!) 140/72 (BP Location: Left Arm, Cuff Size: Normal)   Pulse 68   Temp 97.8 F (36.6 C)   Ht 6\' 1"  (1.854 m)   Wt 200 lb (90.7 kg)   SpO2 97%   BMI 26.39 kg/m   Physical Exam Vitals and nursing note reviewed.  Constitutional:      Appearance: Normal appearance.  Cardiovascular:     Rate and Rhythm: Normal rate.  Pulmonary:     Effort: Pulmonary effort is normal.  Abdominal:     General: There is no distension.  Musculoskeletal:        General: Normal range of motion.  Skin:    General: Skin is warm and dry.  Neurological:     Mental Status: He is alert and oriented to person, place, and time.     Gait: Gait is intact.  Psychiatric:        Mood and Affect: Mood and affect normal.    Diabetic Foot Exam - Simple   Simple Foot Form Diabetic Foot exam was performed with the following findings: Yes 05/13/2023  8:16 AM  Visual Inspection No deformities, no ulcerations, no other skin breakdown bilaterally:  Yes Sensation Testing Intact to touch and monofilament testing bilaterally: Yes Pulse Check Posterior Tibialis and Dorsalis pulse intact bilaterally: Yes Comments      Recent Labs     Component Value Date/Time   NA 134 01/08/2023 1048   NA 140 05/04/2012 2303   K 4.6 01/08/2023 1048   K 3.8 05/04/2012 2303   CL 96 01/08/2023 1048   CL 106 05/04/2012 2303   CO2 24 01/08/2023 1048   CO2  22 05/04/2012 2303   GLUCOSE 240 (H) 01/08/2023 1048   GLUCOSE 113 (H) 05/04/2012 2303   BUN 10 01/08/2023 1048   BUN 9 05/04/2012 2303   CREATININE 1.18 01/08/2023 1048   CREATININE 1.09 05/04/2012 2303   CALCIUM 9.9 01/08/2023 1048   CALCIUM 9.1 05/04/2012 2303   PROT 7.6 01/08/2023 1048   PROT 8.6 (H) 05/04/2012 2303   ALBUMIN 4.3 01/08/2023 1048   ALBUMIN 3.9 05/04/2012 2303   AST 37 01/08/2023 1048   AST 20 05/04/2012 2303   ALT 37 01/08/2023 1048   ALT 25 05/04/2012 2303   ALKPHOS 100 01/08/2023 1048   ALKPHOS 109 05/04/2012 2303   BILITOT 0.7 01/08/2023 1048   BILITOT 0.2 05/04/2012 2303   GFRNONAA >60 05/04/2012 2303   GFRAA >60 05/04/2012 2303    Lab Results  Component Value Date   WBC 4.8 01/08/2023   HGB 14.0 01/08/2023   HCT 43.5 01/08/2023   MCV 90 01/08/2023   PLT 406 01/08/2023   Lab Results  Component Value Date   HGBA1C 9.7 (A) 05/13/2023   Lab Results  Component Value Date   CHOL 226 (H) 01/08/2023   HDL 57 01/08/2023   LDLCALC 156 (H) 01/08/2023   TRIG 76 01/08/2023   CHOLHDL 4.0 01/08/2023   Lab Results  Component Value Date   TSH 2.130 01/08/2023   Lab Results  Component Value Date   HGBA1C 9.7 (A) 05/13/2023   HGBA1C 12.2 (H) 01/08/2023     Assessment and Plan:  Type 2 diabetes mellitus with hyperglycemia, without long-term current use of insulin (HCC) Assessment & Plan: A1c 9.7% today down from 12.2% reflecting improved control, but not yet at goal.  He is tolerating metformin well, so we will continue with this but increase the dose  to 850 mg twice daily.  Repeat BMP and lipids today.  I expect significant improvement in A1c next visit now that he has eliminated sodas.  Unfortunately, this change only occurred about a week ago so it is not reflected in today's A1c.  Recommend diet low in simple carbohydrates such as white starches (bread, pasta, rice) and refined sugar found in desserts and sweetened beverages including juice, sweet tea, and soda.    Orders: -     metFORMIN HCl; Take 1 tablet (850 mg total) by mouth 2 (two) times daily with a meal.  Dispense: 180 tablet; Refill: 1 -     Basic metabolic panel with GFR -     Lipid panel -     POCT glycosylated hemoglobin (Hb A1C)  Hyperlipidemia associated with type 2 diabetes mellitus (HCC) Assessment & Plan: Likely will be initiating statin therapy in the near future.  At present, 10-year ASCVD is estimated at 20.8%.  Will be repeating fasting lipids today for an updated calculation, and will likely have this conversation with the patient tomorrow pending results.    Long term current use of oral hypoglycemic drug Assessment & Plan: Continue metformin   Sleeping difficulty Assessment & Plan: Refill mirtazapine which seemed to be working well for him.  Orders: -     Mirtazapine; Take 1 tablet (7.5 mg total) by mouth at bedtime.  Dispense: 90 tablet; Refill: 1  Benign prostatic hyperplasia with nocturia Assessment & Plan: Continue tamsulosin, recheck PSA   Elevated PSA, less than 10 ng/ml -     PSA  Erectile dysfunction, unspecified erectile dysfunction type Assessment & Plan: Discussed conservative initiation of sildenafil especially with concurrent use of tamsulosin.  Cautioned on first dose hypotension.  Patient verbalizes understanding.  Orders: -     Sildenafil Citrate; Take 0.5-1 tablets (25-50 mg total) by mouth daily as needed for erectile dysfunction. Do not take more than 1 tablet within 24h.  Dispense: 10 tablet; Refill: 0     Return in  about 3 months (around 08/12/2023) for OV f/u DM2.    Alvester Morin, PA-C, DMSc, Nutritionist [facility] [phone]

## 2023-05-13 NOTE — Assessment & Plan Note (Signed)
 Continue tamsulosin, recheck PSA

## 2023-05-13 NOTE — Assessment & Plan Note (Signed)
 Likely will be initiating statin therapy in the near future.  At present, 10-year ASCVD is estimated at 20.8%.  Will be repeating fasting lipids today for an updated calculation, and will likely have this conversation with the patient tomorrow pending results.

## 2023-05-13 NOTE — Assessment & Plan Note (Signed)
 A1c 9.7% today down from 12.2% reflecting improved control, but not yet at goal.  He is tolerating metformin well, so we will continue with this but increase the dose to 850 mg twice daily.  Repeat BMP and lipids today.  I expect significant improvement in A1c next visit now that he has eliminated sodas.  Unfortunately, this change only occurred about a week ago so it is not reflected in today's A1c.  Recommend diet low in simple carbohydrates such as white starches (bread, pasta, rice) and refined sugar found in desserts and sweetened beverages including juice, sweet tea, and soda.

## 2023-05-13 NOTE — Assessment & Plan Note (Signed)
 Continue metformin.

## 2023-05-13 NOTE — Assessment & Plan Note (Signed)
 Refill mirtazapine which seemed to be working well for him.

## 2023-05-14 LAB — LIPID PANEL
Chol/HDL Ratio: 3.2 ratio (ref 0.0–5.0)
Cholesterol, Total: 197 mg/dL (ref 100–199)
HDL: 61 mg/dL (ref >39)
LDL Chol Calc (NIH): 121 mg/dL — ABNORMAL HIGH (ref 0–99)
Triglycerides: 85 mg/dL (ref 0–149)
VLDL Cholesterol Cal: 15 mg/dL (ref 5–40)

## 2023-05-14 LAB — PSA: Prostate Specific Ag, Serum: 5.5 ng/mL — ABNORMAL HIGH (ref 0.0–4.0)

## 2023-05-14 LAB — BASIC METABOLIC PANEL WITH GFR
BUN/Creatinine Ratio: 9 — ABNORMAL LOW (ref 10–24)
BUN: 11 mg/dL (ref 8–27)
CO2: 22 mmol/L (ref 20–29)
Calcium: 9.6 mg/dL (ref 8.6–10.2)
Chloride: 100 mmol/L (ref 96–106)
Creatinine, Ser: 1.19 mg/dL (ref 0.76–1.27)
Glucose: 185 mg/dL — ABNORMAL HIGH (ref 70–99)
Potassium: 4.4 mmol/L (ref 3.5–5.2)
Sodium: 137 mmol/L (ref 134–144)
eGFR: 68 mL/min/{1.73_m2} (ref >59)

## 2023-05-15 ENCOUNTER — Other Ambulatory Visit: Payer: Self-pay | Admitting: Physician Assistant

## 2023-05-15 DIAGNOSIS — E785 Hyperlipidemia, unspecified: Secondary | ICD-10-CM

## 2023-05-15 MED ORDER — ROSUVASTATIN CALCIUM 5 MG PO TABS: 5.0000 mg | ORAL_TABLET | Freq: Every day | ORAL | 3 refills | Status: AC

## 2023-05-16 ENCOUNTER — Other Ambulatory Visit: Payer: Self-pay | Admitting: Physician Assistant

## 2023-05-16 DIAGNOSIS — N401 Enlarged prostate with lower urinary tract symptoms: Secondary | ICD-10-CM

## 2023-05-16 DIAGNOSIS — I10 Essential (primary) hypertension: Secondary | ICD-10-CM

## 2023-05-16 NOTE — Telephone Encounter (Signed)
 Copied from CRM [id number]. Topic: Clinical - Medication Refill >> May 16, 2023 10:35 AM Higinio Roger wrote: Most Recent Primary Care Visit:  Provider: Remo Lipps  Department: [facility]  Visit Type: OFFICE VISIT  Date: 05/13/2023  Medication: tamsulosin (FLOMAX) 0.4 MG CAPS capsule   Has the patient contacted their pharmacy? Yes (Agent: If no, request that the patient contact the pharmacy for the refill. If patient does not wish to contact the pharmacy document the reason why and proceed with request.) (Agent: If yes, when and what did the pharmacy advise?)  Is this the correct pharmacy for this prescription? Yes If no, delete pharmacy and type the correct one.  This is the patient's preferred pharmacy:  [id number] - [place], [place] - [address] [place] [address] Phone: [phone] Fax: [phone]   Has the prescription been filled recently? Yes  Is the patient out of the medication? Yes  Has the patient been seen for an appointment in the last year OR does the patient have an upcoming appointment? Yes  Can we respond through MyChart? No  Agent: Please be advised that Rx refills may take up to 3 business days. We ask that you follow-up with your pharmacy.

## 2023-05-16 NOTE — Telephone Encounter (Signed)
 Rx 01/08/23 #90 3RF- should have RF on file- too soon Requested Prescriptions  Pending Prescriptions Disp Refills   tamsulosin (FLOMAX) 0.4 MG CAPS capsule 90 capsule 3    Sig: Take 1 capsule (0.4 mg total) by mouth daily.     Urology: Alpha-Adrenergic Blocker Failed - 05/16/2023  4:23 PM      Failed - PSA in normal range and within 360 days    Prostate Specific Ag, Serum  Date Value Ref Range Status  05/13/2023 5.5 (H) 0.0 - 4.0 ng/mL Final    Comment:    Roche ECLIA methodology. According to the American Urological Association, Serum PSA should decrease and remain at undetectable levels after radical prostatectomy. The AUA defines biochemical recurrence as an initial PSA value 0.2 ng/mL or greater followed by a subsequent confirmatory PSA value 0.2 ng/mL or greater. Values obtained with different assay methods or kits cannot be used interchangeably. Results cannot be interpreted as absolute evidence of the presence or absence of malignant disease.          Failed - Last BP in normal range    BP Readings from Last 1 Encounters:  05/13/23 (!) 140/72         Passed - Valid encounter within last 12 months    Recent Outpatient Visits           3 days ago Type 2 diabetes mellitus with hyperglycemia, without long-term current use of insulin [facility])   [facility] at [facility], [place], [place]

## 2023-06-09 ENCOUNTER — Other Ambulatory Visit: Payer: Self-pay | Admitting: Pharmacist

## 2023-06-09 DIAGNOSIS — E1165 Type 2 diabetes mellitus with hyperglycemia: Secondary | ICD-10-CM

## 2023-06-09 DIAGNOSIS — E1169 Type 2 diabetes mellitus with other specified complication: Secondary | ICD-10-CM

## 2023-06-09 NOTE — Patient Instructions (Signed)
 Goals Addressed             This Visit's Progress    Pharmacy Goals       The goal A1c is less than 7%. This is the best way to reduce the risk of the long term complications of diabetes, including heart disease, kidney disease, eye disease, strokes, and nerve damage. An A1c of less than 7% corresponds with fasting sugars less than 130 and 2 hour after meal sugars less than 180.   Our goal bad cholesterol, or LDL, is less than 70 . This is why it is important to continue taking your rosuvastatin   Arthur Lash, PharmD, [facility] [phone]

## 2023-06-09 NOTE — Progress Notes (Signed)
 06/09/2023 Name: William Brown MRN: [id number] DOB: 02-Nov-1955  Chief Complaint  Patient presents with   Medication Management    William Brown is a 68 y.o. year old male who presented for a telephone visit.   They were referred to the pharmacist by a quality report for assistance in managing diabetes.    Subjective:  Care Team: Primary Care Provider: Leopoldo Rancher, PA   Medication Access/Adherence  Current Pharmacy:  [facility] [phone] - [place], [place] - [address] [place] [address] Phone: [phone] Fax: [phone]   Patient reports affordability concerns with their medications: No  Patient reports access/transportation concerns to their pharmacy: No  Patient reports adherence concerns with their medications:  No    Diabetes:  Current medications: metformin  850 mg twice daily - Confirms increased as directed by PCP on 05/09/2023 and tolerating well  Current glucose readings: after breakfast ranging 120-160; today: 120 Using Accu-Chek Guide meter; testing three times week  Reports has made significant dietary changes, including - reduced sugary beverage consumption to drinking ~8 oz Sprite once/week, otherwise drinking water  - reports has cut out snacking on Oreo cookies and honey bun, rather being more consistent with meals - reduced intake of cereal  Statin therapy: rosuvastatin  5 mg daily  Current physical activity: stays active throughout the day walking at part-time job and home     Objective:  Lab Results  Component Value Date   HGBA1C 9.7 (A) 05/13/2023    Lab Results  Component Value Date   CREATININE 1.19 05/13/2023   BUN 11 05/13/2023   NA 137 05/13/2023   K 4.4 05/13/2023   CL 100 05/13/2023   CO2 22 05/13/2023    Lab Results  Component Value Date   CHOL 197 05/13/2023   HDL 61 05/13/2023   LDLCALC 121 (H) 05/13/2023   TRIG 85 05/13/2023   CHOLHDL 3.2  05/13/2023    Medications Reviewed Today     Reviewed by Ardis Becton, RPH-CPP (Pharmacist) on 06/09/23 at 1341  Med List Status: <None>   Medication Order Taking? Sig Documenting Provider Last Dose Status Informant  Accu-Chek Softclix Lancets lancets [id number]  Use to check blood sugar up to twice daily Leopoldo Rancher, PA  Active   Blood Glucose Monitoring Suppl (ACCU-CHEK GUIDE ME) w/Device KIT [id number]  Use to check blood sugar up to twice daily Leopoldo Rancher, PA  Active   glucose blood (ACCU-CHEK GUIDE TEST) test strip [id number]  Use to check blood sugar up to twice daily Leopoldo Rancher, PA  Active   metFORMIN  (GLUCOPHAGE ) 850 MG tablet [id number] Yes Take 1 tablet (850 mg total) by mouth 2 (two) times daily with a meal. Leopoldo Rancher, PA Taking Active   mirtazapine  (REMERON ) 7.5 MG tablet [id number]  Take 1 tablet (7.5 mg total) by mouth at bedtime. Leopoldo Rancher, PA  Active   rosuvastatin  (CRESTOR ) 5 MG tablet [id number] Yes Take 1 tablet (5 mg total) by mouth daily. Leopoldo Rancher, PA Taking Active   sildenafil  (VIAGRA ) 50 MG tablet [id number]  Take 0.5-1 tablets (25-50 mg total) by mouth daily as needed for erectile dysfunction. Do not take more than 1 tablet within 24h. Leopoldo Rancher, PA  Active   tamsulosin  (FLOMAX ) 0.4 MG CAPS capsule [id number]  Take 1 capsule (0.4 mg total) by mouth daily. Leopoldo Rancher, PA  Active  Assessment/Plan:   Encourage patient to contact PCP office to schedule 3 month follow up appointment  Diabetes: - Control improved based on home blood sugar readings - Reviewed long term cardiovascular and renal outcomes of uncontrolled blood sugar - Reviewed goal A1c, goal fasting, and goal 2 hour post prandial glucose - Reviewed dietary modifications including importance of having regular well-balanced meals and snacks throughout the day, while controlling carbohydrate portion sizes             Encourage patient  to avoid consumption of sugary beverages, drink water  instead             Encourage patient to increase consumption of non-starchy vegetables - Recommend to continue to check glucose, keep log of results and have this record to review at upcoming medical appointments. Patient to contact provider office sooner if needed for readings outside of established parameters or symptoms - Recommend patient continue on statin therapy for ASCVD risk reduction     Follow Up Plan: Clinical Pharmacist will follow up with patient by telephone on 09/17/2023 at 10:00 AM    Arthur Lash, PharmD, [facility] [phone]

## 2023-07-09 ENCOUNTER — Ambulatory Visit

## 2023-07-09 VITALS — Ht 73.0 in | Wt 205.0 lb

## 2023-07-09 DIAGNOSIS — Z0000 Encounter for general adult medical examination without abnormal findings: Secondary | ICD-10-CM | POA: Diagnosis not present

## 2023-07-09 DIAGNOSIS — Z7984 Long term (current) use of oral hypoglycemic drugs: Secondary | ICD-10-CM

## 2023-07-09 NOTE — Progress Notes (Signed)
 Subjective:   William Brown is a 68 y.o. who presents for a Medicare Wellness preventive visit.  As a reminder, Annual Wellness Visits don't include a physical exam, and some assessments may be limited, especially if this visit is performed virtually. We may recommend an in-person follow-up visit with your provider if needed.  Visit Complete: Virtual I connected with  Tatiana Farrier Lebeck on 07/09/23 by a audio enabled telemedicine application and verified that I am speaking with the correct person using two identifiers.  Patient Location: Home  Provider Location: Home Office  I discussed the limitations of evaluation and management by telemedicine. The patient expressed understanding and agreed to proceed.  Vital Signs: Because this visit was a virtual/telehealth visit, some criteria may be missing or patient reported. Any vitals not documented were not able to be obtained and vitals that have been documented are patient reported.  VideoDeclined- This patient declined Librarian, academic. Therefore the visit was completed with audio only.  Persons Participating in Visit: Patient.  AWV Questionnaire: No: Patient Medicare AWV questionnaire was not completed prior to this visit.  Cardiac Risk Factors include: advanced age (>39men, >68 women);male gender;hypertension;diabetes mellitus;dyslipidemia     Objective:     Today's Vitals   07/09/23 1319  Weight: 205 lb (93 kg)  Height: 6\' 1"  (1.854 m)   Body mass index is 27.05 kg/m.     07/09/2023    1:34 PM 03/18/2023    6:53 AM  Advanced Directives  Does Patient Have a Medical Advance Directive? No No  Would patient like information on creating a medical advance directive? Yes (MAU/Ambulatory/Procedural Areas - Information given) No - Patient declined    Current Medications (verified) Outpatient Encounter Medications as of 07/09/2023  Medication Sig   Accu-Chek Softclix Lancets lancets Use to check blood  sugar up to twice daily   Blood Glucose Monitoring Suppl (ACCU-CHEK GUIDE ME) w/Device KIT Use to check blood sugar up to twice daily   glucose blood (ACCU-CHEK GUIDE TEST) test strip Use to check blood sugar up to twice daily   metFORMIN  (GLUCOPHAGE ) 850 MG tablet Take 1 tablet (850 mg total) by mouth 2 (two) times daily with a meal.   mirtazapine  (REMERON ) 7.5 MG tablet Take 1 tablet (7.5 mg total) by mouth at bedtime.   rosuvastatin  (CRESTOR ) 5 MG tablet Take 1 tablet (5 mg total) by mouth daily.   sildenafil  (VIAGRA ) 50 MG tablet Take 0.5-1 tablets (25-50 mg total) by mouth daily as needed for erectile dysfunction. Do not take more than 1 tablet within 24h.   tamsulosin  (FLOMAX ) 0.4 MG CAPS capsule Take 1 capsule (0.4 mg total) by mouth daily.   No facility-administered encounter medications on file as of 07/09/2023.    Allergies (verified) Patient has no known allergies.   History: Past Medical History:  Diagnosis Date   Diabetes mellitus without complication (HCC)    Hypertension    Prostate disorder    Past Surgical History:  Procedure Laterality Date   COLONOSCOPY WITH PROPOFOL  N/A 03/18/2023   Procedure: COLONOSCOPY WITH PROPOFOL ;  Surgeon: Selena Daily, MD;  Location: [facility];  Service: Endoscopy;  Laterality: N/A;   POLYPECTOMY N/A 03/18/2023   Procedure: POLYPECTOMY;  Surgeon: Selena Daily, MD;  Location: [facility];  Service: Endoscopy;  Laterality: N/A;   Family History  Problem Relation Age of Onset   Hypertension Mother    Diabetes Mother    Diabetes Father    Hypertension  Father    Other Father        auto accident   Social History   Socioeconomic History   Marital status: Divorced    Spouse name: Not on file   Number of children: 6   Years of education: Not on file   Highest education level: Not on file  Occupational History   Occupation: retired  Tobacco Use   Smoking status: Never    Passive exposure: Past    Smokeless tobacco: Never  Vaping Use   Vaping status: Never Used  Substance and Sexual Activity   Alcohol use: Yes    Alcohol/week: 7.0 standard drinks of alcohol    Types: 7 Cans of beer per week    Comment: 1 beer daily   Drug use: Never   Sexual activity: Not Currently  Other Topics Concern   Not on file  Social History Narrative   07/09/23 6 children, 5 living and 1 deceased   Social Drivers of Corporate investment banker Strain: Low Risk  (07/09/2023)   Overall Financial Resource Strain (CARDIA)    Difficulty of Paying Living Expenses: Not hard at all  Food Insecurity: No Food Insecurity (07/09/2023)   Hunger Vital Sign    Worried About Running Out of Food in the Last Year: Never true    Ran Out of Food in the Last Year: Never true  Transportation Needs: No Transportation Needs (07/09/2023)   PRAPARE - Administrator, Civil Service (Medical): No    Lack of Transportation (Non-Medical): No  Physical Activity: Sufficiently Active (07/09/2023)   Exercise Vital Sign    Days of Exercise per Week: 7 days    Minutes of Exercise per Session: 60 min  Stress: No Stress Concern Present (07/09/2023)   [employer] of Occupational Health - Occupational Stress Questionnaire    Feeling of Stress : Not at all  Social Connections: Socially Isolated (07/09/2023)   Social Connection and Isolation Panel [NHANES]    Frequency of Communication with Friends and Family: More than three times a week    Frequency of Social Gatherings with Friends and Family: More than three times a week    Attends Religious Services: Never    Database administrator or Organizations: No    Attends Engineer, structural: Never    Marital Status: Divorced    Tobacco Counseling Counseling given: No    Clinical Intake:  Pre-visit preparation completed: Yes  Pain : No/denies pain     BMI - recorded: 27.05 Nutritional Status: BMI 25 -29 Overweight Nutritional Risks: None Diabetes: Yes CBG  done?: No (FBS 90 per patient) Did pt. bring in CBG monitor from home?: No  Lab Results  Component Value Date   HGBA1C 9.7 (A) 05/13/2023   HGBA1C 12.2 (H) 01/08/2023     How often do you need to have someone help you when you read instructions, pamphlets, or other written materials from your doctor or pharmacy?: 1 - Never  Interpreter Needed?: No  Information entered by :: Jaunita Messier, CMA   Activities of Daily Living     07/09/2023    1:24 PM 03/18/2023    6:52 AM  In your present state of health, do you have any difficulty performing the following activities:  Hearing? 0 0  Vision? 1 0  Comment lost glasses, needs new ones   Difficulty concentrating or making decisions? 0 0  Walking or climbing stairs? 0   Dressing or bathing? 0  Doing errands, shopping? 0   Preparing Food and eating ? N   Using the Toilet? N   In the past six months, have you accidently leaked urine? N   Do you have problems with loss of bowel control? N   Managing your Medications? N   Managing your Finances? N   Housekeeping or managing your Housekeeping? N     Patient Care Team: Leopoldo Rancher, PA as PCP - General (Physician Assistant) Alla Isaacs Severa Daniels, RPH-CPP as Pharmacist Spencer Dy, OD (Optometry) Selena Daily, MD as Consulting Physician (Gastroenterology)  I have updated your Care Teams any recent Medical Services you may have received from other providers in the past year.     Assessment:    This is a routine wellness examination for Mazen.  Hearing/Vision screen Hearing Screening - Comments:: Denies hearing loss Vision Screening - Comments:: Gets DM eye exams, Dr. Spencer Dy [facility] [place]   Goals Addressed             This Visit's Progress    Patient Stated       See a dentist       Depression Screen     07/09/2023    1:31 PM 05/13/2023    8:12 AM 01/08/2023    9:32 AM  PHQ 2/9 Scores  PHQ - 2 Score 0 0 3  PHQ- 9 Score 0  12    Fall Risk      07/09/2023    1:35 PM 05/13/2023    8:12 AM 01/08/2023    9:32 AM  Fall Risk   Falls in the past year? 0 0 0  Number falls in past yr: 0 0 0  Injury with Fall? 0 0 0  Risk for fall due to : No Fall Risks No Fall Risks No Fall Risks  Follow up Falls evaluation completed Falls evaluation completed Falls evaluation completed    MEDICARE RISK AT HOME:  Medicare Risk at Home Any stairs in or around the home?: Yes If so, are there any without handrails?: No Home free of loose throw rugs in walkways, pet beds, electrical cords, etc?: Yes Adequate lighting in your home to reduce risk of falls?: Yes Life alert?: No Use of a cane, walker or w/c?: No Grab bars in the bathroom?: No Shower chair or bench in shower?: No Elevated toilet seat or a handicapped toilet?: No  TIMED UP AND GO:  Was the test performed?  No  Cognitive Function: 6CIT completed        07/09/2023    1:36 PM  6CIT Screen  What Year? 0 points  What month? 0 points  What time? 0 points  Count back from 20 0 points  Months in reverse 0 points  Repeat phrase 0 points  Total Score 0 points    Immunizations Immunization History  Administered Date(s) Administered   Fluad Trivalent(High Dose 65+) 01/08/2023   PNEUMOCOCCAL CONJUGATE-20 01/08/2023   Tdap 06/09/2007    Screening Tests Health Maintenance  Topic Date Due   Diabetic kidney evaluation - Urine ACR  Never done   Zoster Vaccines- Shingrix (1 of 2) Never done   DTaP/Tdap/Td (2 - Td or Tdap) 06/08/2017   COVID-19 Vaccine (1 - 2024-25 season) Never done   INFLUENZA VACCINE  09/05/2023   HEMOGLOBIN A1C  11/12/2023   OPHTHALMOLOGY EXAM  02/19/2024   Diabetic kidney evaluation - eGFR measurement  05/12/2024   FOOT EXAM  05/12/2024   Medicare Annual Wellness (AWV)  07/08/2024   Colonoscopy  03/17/2026   Pneumonia Vaccine 61+ Years old  Completed   Hepatitis C Screening  Completed   HIV Screening  Completed   HPV VACCINES  Aged Out   Meningococcal B  Vaccine  Aged Out    Health Maintenance  Health Maintenance Due  Topic Date Due   Diabetic kidney evaluation - Urine ACR  Never done   Zoster Vaccines- Shingrix (1 of 2) Never done   DTaP/Tdap/Td (2 - Td or Tdap) 06/08/2017   COVID-19 Vaccine (1 - 2024-25 season) Never done   Health Maintenance Items Addressed: See Nurse Notes at the end of this note  Additional Screening:  Vision Screening: Recommended annual ophthalmology exams for early detection of glaucoma and other disorders of the eye. Would you like a referral to an eye doctor? No    Dental Screening: Recommended annual dental exams for proper oral hygiene  Community Resource Referral / Chronic Care Management: CRR required this visit?  No   CCM required this visit?  No   Plan:    I have personally reviewed and noted the following in the patient's chart:   Medical and social history Use of alcohol, tobacco or illicit drugs  Current medications and supplements including opioid prescriptions. Patient is not currently taking opioid prescriptions. Functional ability and status Nutritional status Physical activity Advanced directives List of other physicians Hospitalizations, surgeries, and ER visits in previous 12 months Vitals Screenings to include cognitive, depression, and falls Referrals and appointments  In addition, I have reviewed and discussed with patient certain preventive protocols, quality metrics, and best practice recommendations. A written personalized care plan for preventive services as well as general preventive health recommendations were provided to patient.   Jaunita Messier, CMA   07/09/2023   After Visit Summary: (Mail) Due to this being a telephonic visit, the after visit summary with patients personalized plan was offered to patient via mail   Notes: Please refer to Routing Comments.

## 2023-07-09 NOTE — Patient Instructions (Addendum)
 Mr. William Brown , Thank you for taking time out of your busy schedule to complete your Annual Wellness Visit with me. I enjoyed our conversation and look forward to speaking with you again next year. I, as well as your care team,  appreciate your ongoing commitment to your health goals. Please review the following plan we discussed and let me know if I can assist you in the future. Your Game plan/ To Do List    Referrals: I have placed a referral to [facility]. Someone from their office will call you.    Follow up Visits: Next Medicare AWV with our clinical staff: 07/15/23 @ 1:20pm (phone visit)   Have you seen your provider in the last 6 months (3 months if uncontrolled diabetes)? Yes Next Office Visit with your provider:  08/13/23 @ 9:00 am with Laroy Plunk, PA  Clinician Recommendations: I have included some dental resources for you to review.  Aim for 30 minutes of exercise or brisk walking, 6-8 glasses of water , and 5 servings of fruits and vegetables each day.       This is a list of the screening recommended for you and due dates:  Health Maintenance  Topic Date Due   Yearly kidney health urinalysis for diabetes  Never done   Zoster (Shingles) Vaccine (1 of 2) Never done   DTaP/Tdap/Td vaccine (2 - Td or Tdap) 06/08/2017   COVID-19 Vaccine (1 - 2024-25 season) Never done   Flu Shot  09/05/2023   Hemoglobin A1C  11/12/2023   Eye exam for diabetics  02/19/2024   Yearly kidney function blood test for diabetes  05/12/2024   Complete foot exam   05/12/2024   Medicare Annual Wellness Visit  07/08/2024   Colon Cancer Screening  03/17/2026   Pneumonia Vaccine  Completed   Hepatitis C Screening  Completed   HIV Screening  Completed   HPV Vaccine  Aged Out   Meningitis B Vaccine  Aged Out    Advanced directives: (Provided) Advance directive discussed with you today. I have provided a copy for you to complete at home and have notarized. Once this is complete, please  bring a copy in to our office so we can scan it into your chart.  Advance Care Planning is important because it:  [x]  Makes sure you receive the medical care that is consistent with your values, goals, and preferences  [x]  It provides guidance to your family and loved ones and reduces their decisional burden about whether or not they are making the right decisions based on your wishes.  Follow the link provided in your after visit summary or read over the paperwork we have mailed to you to help you started getting your Advance Directives in place. If you need assistance in completing these, please reach out to us  so that we can help you!  See attachments for Preventive Care and Fall Prevention Tips.   Here are some dental resources that you can contact to see what help may be available to you:   [facility] [phone] [email or web address]  Provides [facility] and related tooth extraction services. Located in [place] and opened in 1978. Treats patients who travel from [place], [place] and many other communities in the surrounding areas.  [facility] [phone] Toll Free [phone] Local [employer]  Provides donated dental care to those who are disabled, medically compromised, or elderly (65+) and who have no financial resources with which to pay for their extensive dental care needs. No cost  to approved applicants. People in a position to pay for part of their care may be encouraged to contribute, especially when lab work is involved. Call for application. Complete application form, sign, and mail to DDS. Once application is received, it is processed and the applicant is placed on a waiting list. When a dentist is in applicant's area is available, applicant will be contacted directly to complete intake interview and confirm eligibility. Must be permanently disabled, critically ill, or elderly (65+) and have  no financial resources to pay. DDS does NOT cover routine or emergency care.  [place]  [facility]) [phone] Operates portable free dental clinics through [place]  as an outreach program of the [employer]. Provides free dental care to as many underserved persons within [place]  as possible. To see upcoming clinic dates: Visit: MobileMusical.fi  [facility] [phone] General Clinic Information [phone] Patient Admissions Dental care and treatment provided by students enrolled in the Doctorate in Dental Surgery program (D.D.S.) and dental hygiene and dental assisting students. Services provided in all area of dental care including: fillings, crowns and bridges, complete and partial dentures, implants, gum treatments, root canals, extractions, as well as preventive care. Fees vary depending on treatment - usually 1/3 to 2/3 the cost treatment dentist offices. Dentures usually less than 1/3 the normal cost. A monthly drawing is held at the beginning of each month. If your application is selected, it will be reviewed and placed on a waiting list for a screening appointment. Call and have application mailed to you   Fall Prevention in the Home, Adult Falls can cause injuries and affect people of all ages. There are many simple things that you can do to make your home safe and to help prevent falls. If you need it, ask for help making these changes. What actions can I take to prevent falls? General information Use good lighting in all rooms. Make sure to: Replace any light bulbs that burn out. Turn on lights if it is dark and use night-lights. Keep items that you use often in easy-to-reach places. Lower the shelves around your home if needed. Move furniture so that there are clear paths around it. Do not keep throw rugs or other things on the floor that can make you trip. If any of  your floors are uneven, fix them. Add color or contrast paint or tape to clearly mark and help you see: Grab bars or handrails. First and last steps of staircases. Where the edge of each step is. If you use a ladder or stepladder: Make sure that it is fully opened. Do not climb a closed ladder. Make sure the sides of the ladder are locked in place. Have someone hold the ladder while you use it. Know where your pets are as you move through your home. What can I do in the bathroom?     Keep the floor dry. Clean up any water  that is on the floor right away. Remove soap buildup in the bathtub or shower. Buildup makes bathtubs and showers slippery. Use non-skid mats or decals on the floor of the bathtub or shower. Attach bath mats securely with double-sided, non-slip rug tape. If you need to sit down while you are in the shower, use a non-slip stool. Install grab bars by the toilet and in the bathtub and shower. Do not use towel bars as grab bars. What can I do in the bedroom? Make sure that you have a light by your bed  that is easy to reach. Do not use any sheets or blankets on your bed that hang to the floor. Have a firm bench or chair with side arms that you can use for support when you get dressed. What can I do in the kitchen? Clean up any spills right away. If you need to reach something above you, use a sturdy step stool that has a grab bar. Keep electrical cables out of the way. Do not use floor polish or wax that makes floors slippery. What can I do with my stairs? Do not leave anything on the stairs. Make sure that you have a light switch at the top and the bottom of the stairs. Have them installed if you do not have them. Make sure that there are handrails on both sides of the stairs. Fix handrails that are broken or loose. Make sure that handrails are as long as the staircases. Install non-slip stair treads on all stairs in your home if they do not have carpet. Avoid having  throw rugs at the top or bottom of stairs, or secure the rugs with carpet tape to prevent them from moving. Choose a carpet design that does not hide the edge of steps on the stairs. Make sure that carpet is firmly attached to the stairs. Fix any carpet that is loose or worn. What can I do on the outside of my home? Use bright outdoor lighting. Repair the edges of walkways and driveways and fix any cracks. Clear paths of anything that can make you trip, such as tools or rocks. Add color or contrast paint or tape to clearly mark and help you see high doorway thresholds. Trim any bushes or trees on the main path into your home. Check that handrails are securely fastened and in good repair. Both sides of all steps should have handrails. Install guardrails along the edges of any raised decks or porches. Have leaves, snow, and ice cleared regularly. Use sand, salt, or ice melt on walkways during winter months if you live where there is ice and snow. In the garage, clean up any spills right away, including grease or oil spills. What other actions can I take? Review your medicines with your health care provider. Some medicines can make you confused or feel dizzy. This can increase your chance of falling. Wear closed-toe shoes that fit well and support your feet. Wear shoes that have rubber soles and low heels. Use a cane, walker, scooter, or crutches that help you move around if needed. Talk with your provider about other ways that you can decrease your risk of falls. This may include seeing a physical therapist to learn to do exercises to improve movement and strength. Where to find more information Centers for Disease Control and Prevention, STEADI: TonerPromos.no [employer] on Aging: BaseRingTones.pl National Institute on Aging: BaseRingTones.pl Contact a health care provider if: You are afraid of falling at home. You feel weak, drowsy, or dizzy at home. You fall at home. Get help right away if you: Lose  consciousness or have trouble moving after a fall. Have a fall that causes a head injury. These symptoms may be an emergency. Get help right away. Call 911. Do not wait to see if the symptoms will go away. Do not drive yourself to the hospital. This information is not intended to replace advice given to you by your health care provider. Make sure you discuss any questions you have with your health care provider. Document Revised: 09/24/2021 Document Reviewed: 09/24/2021  Elsevier Patient Education  2024 [employer].

## 2023-07-17 ENCOUNTER — Other Ambulatory Visit: Payer: Self-pay | Admitting: Physician Assistant

## 2023-07-17 DIAGNOSIS — E1165 Type 2 diabetes mellitus with hyperglycemia: Secondary | ICD-10-CM

## 2023-08-13 ENCOUNTER — Encounter: Payer: Self-pay | Admitting: Physician Assistant

## 2023-08-13 ENCOUNTER — Ambulatory Visit (INDEPENDENT_AMBULATORY_CARE_PROVIDER_SITE_OTHER): Admitting: Physician Assistant

## 2023-08-13 VITALS — BP 130/78 | HR 73 | Temp 97.9°F | Ht 73.0 in | Wt 195.0 lb

## 2023-08-13 DIAGNOSIS — E1165 Type 2 diabetes mellitus with hyperglycemia: Secondary | ICD-10-CM

## 2023-08-13 DIAGNOSIS — E785 Hyperlipidemia, unspecified: Secondary | ICD-10-CM | POA: Diagnosis not present

## 2023-08-13 DIAGNOSIS — I10 Essential (primary) hypertension: Secondary | ICD-10-CM

## 2023-08-13 DIAGNOSIS — E1169 Type 2 diabetes mellitus with other specified complication: Secondary | ICD-10-CM | POA: Diagnosis not present

## 2023-08-13 DIAGNOSIS — Z7984 Long term (current) use of oral hypoglycemic drugs: Secondary | ICD-10-CM | POA: Diagnosis not present

## 2023-08-13 LAB — POCT GLYCOSYLATED HEMOGLOBIN (HGB A1C): Hemoglobin A1C: 7.4 % — AB (ref 4.0–5.6)

## 2023-08-13 MED ORDER — METFORMIN HCL 850 MG PO TABS: 850.0000 mg | ORAL_TABLET | Freq: Two times a day (BID) | ORAL | 0 refills | Status: DC

## 2023-08-13 NOTE — Assessment & Plan Note (Signed)
 Tolerating rosuvastatin  well, continue at this dose, plan for fasting lipids next time.

## 2023-08-13 NOTE — Assessment & Plan Note (Signed)
 Continue metformin.

## 2023-08-13 NOTE — Assessment & Plan Note (Signed)
 Seems well-controlled with tamsulosin , which we were primarily using for BPH.  Follow clinically

## 2023-08-13 NOTE — Assessment & Plan Note (Signed)
 Patient continues to do well with metformin .  A1c 7.4% today drastically improved from 12.2% roughly 7 months ago, congratulated on this.  I expect continued downward trend as time goes on, so we will see what his next A1c is before changing metformin  dose again.  For now, sending a refill of the 850 mg dose as below.

## 2023-08-13 NOTE — Progress Notes (Signed)
 Date:  08/13/2023   Name:  William Brown   DOB:  May 16, 1955   MRN:  [id number]   Chief Complaint: Medical Management of Chronic Issues  HPI William Brown presents for 3 month f/u on chronic conditions including DM2, HTN, HLD.  Last visit we increased metformin  to 850 mg twice daily, also added rosuvastatin  5 mg daily for ASCVD risk reduction.  He has been tolerating all medications well and reports good compliance.  He continues with healthy lifestyle changes including daily walks and reduction of sugar intake.  Still checking fasting sugars most days, says it was 70 yesterday.  Overall he feels like he is doing well.   Medication list has been reviewed and updated.  Current Meds  Medication Sig   Accu-Chek Softclix Lancets lancets Use to check blood sugar up to twice daily   Blood Glucose Monitoring Suppl (ACCU-CHEK GUIDE ME) w/Device KIT Use to check blood sugar up to twice daily   glucose blood (ACCU-CHEK GUIDE TEST) test strip Use to check blood sugar up to twice daily   mirtazapine  (REMERON ) 7.5 MG tablet Take 1 tablet (7.5 mg total) by mouth at bedtime.   rosuvastatin  (CRESTOR ) 5 MG tablet Take 1 tablet (5 mg total) by mouth daily.   sildenafil  (VIAGRA ) 50 MG tablet Take 0.5-1 tablets (25-50 mg total) by mouth daily as needed for erectile dysfunction. Do not take more than 1 tablet within 24h.   tamsulosin  (FLOMAX ) 0.4 MG CAPS capsule Take 1 capsule (0.4 mg total) by mouth daily.   [DISCONTINUED] metFORMIN  (GLUCOPHAGE ) 850 MG tablet Take 1 tablet (850 mg total) by mouth 2 (two) times daily with a meal.     Review of Systems  Patient Active Problem List   Diagnosis Date Noted   Erectile dysfunction 05/13/2023   Long term current use of oral hypoglycemic drug 05/13/2023   Screening for colon cancer 03/18/2023   Polyp of descending colon 03/18/2023   Polyp of ascending colon 03/18/2023   Elevated PSA, less than 10 ng/ml 01/09/2023   Hyperlipidemia associated with type 2 diabetes  mellitus (HCC) 01/09/2023   Type 2 diabetes mellitus with hyperglycemia (HCC) 01/09/2023   Benign prostatic hyperplasia with nocturia 01/08/2023   Premature atrial contractions 01/08/2023   Dyspnea on exertion 01/08/2023   Primary hypertension 01/08/2023   Sleeping difficulty 01/08/2023    No Known Allergies  Immunization History  Administered Date(s) Administered   Fluad Trivalent(High Dose 65+) 01/08/2023   PNEUMOCOCCAL CONJUGATE-20 01/08/2023   Tdap 06/09/2007    Past Surgical History:  Procedure Laterality Date   COLONOSCOPY WITH PROPOFOL  N/A 03/18/2023   Procedure: COLONOSCOPY WITH PROPOFOL ;  Surgeon: Unk Corinn Skiff, MD;  Location: [facility];  Service: Endoscopy;  Laterality: N/A;   POLYPECTOMY N/A 03/18/2023   Procedure: POLYPECTOMY;  Surgeon: Unk Corinn Skiff, MD;  Location: [facility];  Service: Endoscopy;  Laterality: N/A;    Social History   Tobacco Use   Smoking status: Never    Passive exposure: Past   Smokeless tobacco: Never  Vaping Use   Vaping status: Never Used  Substance Use Topics   Alcohol use: Yes    Alcohol/week: 7.0 standard drinks of alcohol    Types: 7 Cans of beer per week    Comment: 1 beer daily   Drug use: Never    Family History  Problem Relation Age of Onset   Hypertension Mother    Diabetes Mother    Diabetes Father    Hypertension Father  Other Father        auto accident        08/13/2023    8:59 AM 05/13/2023    8:12 AM 01/08/2023    9:32 AM  GAD 7 : Generalized Anxiety Score  Nervous, Anxious, on Edge 0 0 0  Control/stop worrying 0 0 0  Worry too much - different things 0 0 1  Trouble relaxing 0 0 1  Restless 0 0 1  Easily annoyed or irritable 0 0 0  Afraid - awful might happen 0 0 0  Total GAD 7 Score 0 0 3  Anxiety Difficulty Not difficult at all Not difficult at all Somewhat difficult       08/13/2023    8:59 AM 07/09/2023    1:31 PM 05/13/2023    8:12 AM  Depression screen PHQ 2/9   Decreased Interest 0 0 0  Down, Depressed, Hopeless 0 0 0  PHQ - 2 Score 0 0 0  Altered sleeping  0   Tired, decreased energy  0   Change in appetite  0   Feeling bad or failure about yourself   0   Trouble concentrating  0   Moving slowly or fidgety/restless  0   Suicidal thoughts  0   PHQ-9 Score  0   Difficult doing work/chores  Not difficult at all     BP Readings from Last 3 Encounters:  08/13/23 130/78  05/13/23 (!) 140/72  03/18/23 126/64    Wt Readings from Last 3 Encounters:  08/13/23 195 lb (88.5 kg)  07/09/23 205 lb (93 kg)  05/13/23 200 lb (90.7 kg)    BP 130/78   Pulse 73   Temp 97.9 F (36.6 C)   Ht 6' 1 (1.854 m)   Wt 195 lb (88.5 kg)   SpO2 97%   BMI 25.73 kg/m   Physical Exam Vitals and nursing note reviewed.  Constitutional:      Appearance: Normal appearance.  Cardiovascular:     Rate and Rhythm: Normal rate.  Pulmonary:     Effort: Pulmonary effort is normal.  Abdominal:     General: There is no distension.  Musculoskeletal:        General: Normal range of motion.  Skin:    General: Skin is warm and dry.  Neurological:     Mental Status: He is alert and oriented to person, place, and time.     Gait: Gait is intact.  Psychiatric:        Mood and Affect: Mood and affect normal.     Recent Labs     Component Value Date/Time   NA 137 05/13/2023 0902   NA 140 05/04/2012 2303   K 4.4 05/13/2023 0902   K 3.8 05/04/2012 2303   CL 100 05/13/2023 0902   CL 106 05/04/2012 2303   CO2 22 05/13/2023 0902   CO2 22 05/04/2012 2303   GLUCOSE 185 (H) 05/13/2023 0902   GLUCOSE 113 (H) 05/04/2012 2303   BUN 11 05/13/2023 0902   BUN 9 05/04/2012 2303   CREATININE 1.19 05/13/2023 0902   CREATININE 1.09 05/04/2012 2303   CALCIUM  9.6 05/13/2023 0902   CALCIUM  9.1 05/04/2012 2303   PROT 7.6 01/08/2023 1048   PROT 8.6 (H) 05/04/2012 2303   ALBUMIN 4.3 01/08/2023 1048   ALBUMIN 3.9 05/04/2012 2303   AST 37 01/08/2023 1048   AST 20  05/04/2012 2303   ALT 37 01/08/2023 1048   ALT 25 05/04/2012 2303  ALKPHOS 100 01/08/2023 1048   ALKPHOS 109 05/04/2012 2303   BILITOT 0.7 01/08/2023 1048   BILITOT 0.2 05/04/2012 2303   GFRNONAA >60 05/04/2012 2303   GFRAA >60 05/04/2012 2303    Lab Results  Component Value Date   WBC 4.8 01/08/2023   HGB 14.0 01/08/2023   HCT 43.5 01/08/2023   MCV 90 01/08/2023   PLT 406 01/08/2023   Lab Results  Component Value Date   HGBA1C 7.4 (A) 08/13/2023   Lab Results  Component Value Date   CHOL 197 05/13/2023   HDL 61 05/13/2023   LDLCALC 121 (H) 05/13/2023   TRIG 85 05/13/2023   CHOLHDL 3.2 05/13/2023   Lab Results  Component Value Date   TSH 2.130 01/08/2023   Lab Results  Component Value Date   HGBA1C 7.4 (A) 08/13/2023   HGBA1C 9.7 (A) 05/13/2023   HGBA1C 12.2 (H) 01/08/2023     Assessment and Plan:  Type 2 diabetes mellitus with hyperglycemia, without long-term current use of insulin (HCC) Assessment & Plan: Patient continues to do well with metformin .  A1c 7.4% today drastically improved from 12.2% roughly 7 months ago, congratulated on this.  I expect continued downward trend as time goes on, so we will see what his next A1c is before changing metformin  dose again.  For now, sending a refill of the 850 mg dose as below.  Orders: -     POCT glycosylated hemoglobin (Hb A1C) -     metFORMIN  HCl; Take 1 tablet (850 mg total) by mouth 2 (two) times daily with a meal.  Dispense: 180 tablet; Refill: 0  Long term current use of oral hypoglycemic drug Assessment & Plan: Continue metformin    Hyperlipidemia associated with type 2 diabetes mellitus (HCC) Assessment & Plan: Tolerating rosuvastatin  well, continue at this dose, plan for fasting lipids next time.   Primary hypertension Assessment & Plan: Seems well-controlled with tamsulosin , which we were primarily using for BPH.  Follow clinically      Return in about 3 months (around 11/13/2023) for Fasting  OV f/u chronic conditions.    Rolan Hoyle, PA-C, DMSc, Nutritionist [facility] Primary Care and Sports Medicine [facility] [phone]

## 2023-08-14 ENCOUNTER — Telehealth: Payer: Self-pay | Admitting: Physician Assistant

## 2023-08-14 ENCOUNTER — Other Ambulatory Visit: Payer: Self-pay | Admitting: Physician Assistant

## 2023-08-14 DIAGNOSIS — E1165 Type 2 diabetes mellitus with hyperglycemia: Secondary | ICD-10-CM

## 2023-08-14 DIAGNOSIS — N529 Male erectile dysfunction, unspecified: Secondary | ICD-10-CM

## 2023-08-14 DIAGNOSIS — G479 Sleep disorder, unspecified: Secondary | ICD-10-CM
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
## 2023-08-14 NOTE — Telephone Encounter (Signed)
 Pt called. Pt aware to call pharmacy.  Kp

## 2023-08-14 NOTE — Telephone Encounter (Signed)
 Please review.  KP

## 2023-08-18 NOTE — Progress Notes (Deleted)
  Start: *** end: *** Patient is here today *** Patient would like to learn *** Patient lives with ***.  *** shopping and cooking.  History includes:  *** Medications include:  *** Labs noted:  ***  7.4%, met

## 2023-08-25 ENCOUNTER — Ambulatory Visit: Admitting: Dietician

## 2023-08-25 DIAGNOSIS — Z7984 Long term (current) use of oral hypoglycemic drugs: Secondary | ICD-10-CM

## 2023-08-25 DIAGNOSIS — E119 Type 2 diabetes mellitus without complications: Secondary | ICD-10-CM

## 2023-09-17 ENCOUNTER — Other Ambulatory Visit: Payer: Self-pay | Admitting: Pharmacist

## 2023-09-19 ENCOUNTER — Telehealth: Payer: Self-pay | Admitting: Pharmacist

## 2023-09-19 NOTE — Progress Notes (Signed)
   Outreach Note  09/19/2023 Name: William Brown MRN: [id number] DOB: 10-Sep-1955  Referred by: Manya Toribio SQUIBB, PA  Was unable to reach patient via telephone today and unable to leave a message as voicemail box is not setup  Follow Up Plan: Will collaborate with Care Guide to outreach to schedule follow up with me  Sharyle Sia, PharmD, [facility] [phone]

## 2023-10-09 ENCOUNTER — Ambulatory Visit (INDEPENDENT_AMBULATORY_CARE_PROVIDER_SITE_OTHER): Admitting: Physician Assistant

## 2023-10-09 ENCOUNTER — Encounter: Payer: Self-pay | Admitting: Physician Assistant

## 2023-10-09 VITALS — BP 122/74 | HR 71 | Temp 97.8°F | Ht 73.0 in | Wt 199.0 lb

## 2023-10-09 DIAGNOSIS — L723 Sebaceous cyst: Secondary | ICD-10-CM | POA: Diagnosis not present

## 2023-10-09 MED ORDER — DOXYCYCLINE HYCLATE 100 MG PO TABS: 100.0000 mg | ORAL_TABLET | Freq: Two times a day (BID) | ORAL | 0 refills | Status: AC

## 2023-10-09 NOTE — Progress Notes (Signed)
 Date:  10/09/2023   Name:  William Brown   DOB:  [birth year]   MRN:  [id number]   Chief Complaint: Arm Pain (Patient said he has a knot: under his right armpit X 1 month. Painful to touch. Growing in size. Pt has hx of boils in the past but this is lasting longer than his normal boils.)  Arm Pain    William Brown presents with complaint of axillary mass as described above, nontender, no discharge.  Seems to be growing.   Medication list has been reviewed and updated.  Current Meds  Medication Sig   Accu-Chek Softclix Lancets lancets Use to check blood sugar up to twice daily   Blood Glucose Monitoring Suppl (ACCU-CHEK GUIDE ME) w/Device KIT Use to check blood sugar up to twice daily   doxycycline  (VIBRA -TABS) 100 MG tablet Take 1 tablet (100 mg total) by mouth 2 (two) times daily for 7 days. Do not take with dairy. This medication INCREASES SUN SENSITIVITY so avoid direct sunlight.   glucose blood (ACCU-CHEK GUIDE TEST) test strip Use to check blood sugar up to twice daily   metFORMIN  (GLUCOPHAGE ) 850 MG tablet Take 1 tablet (850 mg total) by mouth 2 (two) times daily with a meal.   mirtazapine  (REMERON ) 7.5 MG tablet Take 1 tablet (7.5 mg total) by mouth at bedtime.   rosuvastatin  (CRESTOR ) 5 MG tablet Take 1 tablet (5 mg total) by mouth daily.   sildenafil  (VIAGRA ) 50 MG tablet Take 0.5-1 tablets (25-50 mg total) by mouth daily as needed for erectile dysfunction. Do not take more than 1 tablet within 24h.   tamsulosin  (FLOMAX ) 0.4 MG CAPS capsule Take 1 capsule (0.4 mg total) by mouth daily.     Review of Systems  Patient Active Problem List   Diagnosis Date Noted   Erectile dysfunction 05/13/2023   Long term current use of oral hypoglycemic drug 05/13/2023   Screening for colon cancer 03/18/2023   Polyp of descending colon 03/18/2023   Polyp of ascending colon 03/18/2023   Elevated PSA, less than 10 ng/ml 01/09/2023   Hyperlipidemia associated with type 2 diabetes mellitus (HCC)  01/09/2023   Type 2 diabetes mellitus with hyperglycemia (HCC) 01/09/2023   Benign prostatic hyperplasia with nocturia 01/08/2023   Premature atrial contractions 01/08/2023   Dyspnea on exertion 01/08/2023   Primary hypertension 01/08/2023   Sleeping difficulty 01/08/2023    No Known Allergies  Immunization History  Administered Date(s) Administered   Fluad Trivalent(High Dose 65+) 01/08/2023   PNEUMOCOCCAL CONJUGATE-20 01/08/2023   Tdap 06/09/2007    Past Surgical History:  Procedure Laterality Date   COLONOSCOPY WITH PROPOFOL  N/A 03/18/2023   Procedure: COLONOSCOPY WITH PROPOFOL ;  Surgeon: Unk Corinn Skiff, MD;  Location: [facility];  Service: Endoscopy;  Laterality: N/A;   POLYPECTOMY N/A 03/18/2023   Procedure: POLYPECTOMY;  Surgeon: Unk Corinn Skiff, MD;  Location: [facility];  Service: Endoscopy;  Laterality: N/A;    Social History   Tobacco Use   Smoking status: Never    Passive exposure: Past   Smokeless tobacco: Never  Vaping Use   Vaping status: Never Used  Substance Use Topics   Alcohol use: Yes    Alcohol/week: 7.0 standard drinks of alcohol    Types: 7 Cans of beer per week    Comment: 1 beer daily   Drug use: Never    Family History  Problem Relation Age of Onset   Hypertension Mother    Diabetes Mother  Diabetes Father    Hypertension Father    Other Father        auto accident        10/09/2023    4:08 PM 08/13/2023    8:59 AM 05/13/2023    8:12 AM 01/08/2023    9:32 AM  GAD 7 : Generalized Anxiety Score  Nervous, Anxious, on Edge 0 0 0 0  Control/stop worrying 0 0 0 0  Worry too much - different things 0 0 0 1  Trouble relaxing 0 0 0 1  Restless 0 0 0 1  Easily annoyed or irritable 0 0 0 0  Afraid - awful might happen 0 0 0 0  Total GAD 7 Score 0 0 0 3  Anxiety Difficulty Not difficult at all Not difficult at all Not difficult at all Somewhat difficult       10/09/2023    4:08 PM 08/13/2023    8:59 AM 07/09/2023     1:31 PM  Depression screen PHQ 2/9  Decreased Interest 0 0 0  Down, Depressed, Hopeless 0 0 0  PHQ - 2 Score 0 0 0  Altered sleeping 0  0  Tired, decreased energy 0  0  Change in appetite 0  0  Feeling bad or failure about yourself  0  0  Trouble concentrating 0  0  Moving slowly or fidgety/restless 0  0  Suicidal thoughts 0  0  PHQ-9 Score 0  0  Difficult doing work/chores Not difficult at all  Not difficult at all    BP Readings from Last 3 Encounters:  10/09/23 122/74  08/13/23 130/78  05/13/23 (!) 140/72    Wt Readings from Last 3 Encounters:  10/09/23 199 lb (90.3 kg)  08/13/23 195 lb (88.5 kg)  07/09/23 205 lb (93 kg)    BP 122/74   Pulse 71   Temp 97.8 F (36.6 C) (Oral)   Ht 6' 1 (1.854 m)   Wt 199 lb (90.3 kg)   SpO2 99%   BMI 26.25 kg/m   Physical Exam Vitals and nursing note reviewed.  Constitutional:      Appearance: Normal appearance.  Cardiovascular:     Rate and Rhythm: Normal rate.  Pulmonary:     Effort: Pulmonary effort is normal.  Chest:     Chest wall: No mass.  Abdominal:     General: There is no distension.  Musculoskeletal:        General: Normal range of motion.  Skin:    General: Skin is warm and dry.     Comments: 2 cm semifirm mobile mass in the right axilla which is not tender and does not have overlying skin changes.  Neurological:     Mental Status: He is alert and oriented to person, place, and time.     Gait: Gait is intact.  Psychiatric:        Mood and Affect: Mood and affect normal.     Recent Labs     Component Value Date/Time   NA 137 05/13/2023 0902   NA 140 05/04/2012 2303   K 4.4 05/13/2023 0902   K 3.8 05/04/2012 2303   CL 100 05/13/2023 0902   CL 106 05/04/2012 2303   CO2 22 05/13/2023 0902   CO2 22 05/04/2012 2303   GLUCOSE 185 (H) 05/13/2023 0902   GLUCOSE 113 (H) 05/04/2012 2303   BUN 11 05/13/2023 0902   BUN 9 05/04/2012 2303   CREATININE 1.19 05/13/2023 0902  CREATININE 1.09 05/04/2012 2303    CALCIUM  9.6 05/13/2023 0902   CALCIUM  9.1 05/04/2012 2303   PROT 7.6 01/08/2023 1048   PROT 8.6 (H) 05/04/2012 2303   ALBUMIN 4.3 01/08/2023 1048   ALBUMIN 3.9 05/04/2012 2303   AST 37 01/08/2023 1048   AST 20 05/04/2012 2303   ALT 37 01/08/2023 1048   ALT 25 05/04/2012 2303   ALKPHOS 100 01/08/2023 1048   ALKPHOS 109 05/04/2012 2303   BILITOT 0.7 01/08/2023 1048   BILITOT 0.2 05/04/2012 2303   GFRNONAA >60 05/04/2012 2303   GFRAA >60 05/04/2012 2303    Lab Results  Component Value Date   WBC 4.8 01/08/2023   HGB 14.0 01/08/2023   HCT 43.5 01/08/2023   MCV 90 01/08/2023   PLT 406 01/08/2023   Lab Results  Component Value Date   HGBA1C 7.4 (A) 08/13/2023   HGBA1C 9.7 (A) 05/13/2023   HGBA1C 12.2 (H) 01/08/2023   Lab Results  Component Value Date   CHOL 197 05/13/2023   HDL 61 05/13/2023   LDLCALC 121 (H) 05/13/2023   TRIG 85 05/13/2023   CHOLHDL 3.2 05/13/2023   Lab Results  Component Value Date   TSH 2.130 01/08/2023      Assessment and Plan:  1. Sebaceous cyst of right axilla (Primary) Favor sebaceous cyst.  Patient education printed.  Advised warm compress twice a day in addition to oral antibiotic the next week.  Will follow-up on this in about 10 days.  - doxycycline  (VIBRA -TABS) 100 MG tablet; Take 1 tablet (100 mg total) by mouth 2 (two) times daily for 7 days. Do not take with dairy. This medication INCREASES SUN SENSITIVITY so avoid direct sunlight.  Dispense: 14 tablet; Refill: 0    Return in about 11 days (around 10/20/2023) for OV f/u cyst of armpit.    Rolan Hoyle, PA-C, DMSc, Nutritionist [facility] Primary Care and Sports Medicine [facility] [phone]

## 2023-10-20 ENCOUNTER — Encounter: Payer: Self-pay | Admitting: Physician Assistant

## 2023-10-20 ENCOUNTER — Ambulatory Visit (INDEPENDENT_AMBULATORY_CARE_PROVIDER_SITE_OTHER): Admitting: Physician Assistant

## 2023-10-20 VITALS — BP 124/62 | HR 70 | Temp 98.4°F | Ht 73.0 in | Wt 194.0 lb

## 2023-10-20 DIAGNOSIS — L723 Sebaceous cyst: Secondary | ICD-10-CM

## 2023-10-20 NOTE — Progress Notes (Signed)
 Date:  10/20/2023   Name:  William Brown   DOB:  [birth year]   MRN:  [id number]   Chief Complaint: Cyst (On armpit, small spot still there, right underarm not painful )  HPI  William Brown returns for 11-day recheck of right axillary mass thought to be sebaceous cyst vs abscess. He was treated with a course of doxycycline  with warm compress, resulting in shrinkage of the area. It is still nontender, no discharge.    (PRIOR NOTE 10/09/23): Patient said he has a knot: under his right armpit X 1 month. Painful to touch. Growing in size. Pt has hx of boils in the past but this is lasting longer than his normal boils   Medication list has been reviewed and updated.  Current Meds  Medication Sig   Accu-Chek Softclix Lancets lancets Use to check blood sugar up to twice daily   Blood Glucose Monitoring Suppl (ACCU-CHEK GUIDE ME) w/Device KIT Use to check blood sugar up to twice daily   glucose blood (ACCU-CHEK GUIDE TEST) test strip Use to check blood sugar up to twice daily   metFORMIN  (GLUCOPHAGE ) 850 MG tablet Take 1 tablet (850 mg total) by mouth 2 (two) times daily with a meal.   mirtazapine  (REMERON ) 7.5 MG tablet Take 1 tablet (7.5 mg total) by mouth at bedtime.   rosuvastatin  (CRESTOR ) 5 MG tablet Take 1 tablet (5 mg total) by mouth daily.   sildenafil  (VIAGRA ) 50 MG tablet Take 0.5-1 tablets (25-50 mg total) by mouth daily as needed for erectile dysfunction. Do not take more than 1 tablet within 24h.   tamsulosin  (FLOMAX ) 0.4 MG CAPS capsule Take 1 capsule (0.4 mg total) by mouth daily.     Review of Systems  Patient Active Problem List   Diagnosis Date Noted   Erectile dysfunction 05/13/2023   Long term current use of oral hypoglycemic drug 05/13/2023   Screening for colon cancer 03/18/2023   Polyp of descending colon 03/18/2023   Polyp of ascending colon 03/18/2023   Elevated PSA, less than 10 ng/ml 01/09/2023   Hyperlipidemia associated with type 2 diabetes mellitus (HCC)  01/09/2023   Type 2 diabetes mellitus with hyperglycemia (HCC) 01/09/2023   Benign prostatic hyperplasia with nocturia 01/08/2023   Premature atrial contractions 01/08/2023   Dyspnea on exertion 01/08/2023   Primary hypertension 01/08/2023   Sleeping difficulty 01/08/2023    No Known Allergies  Immunization History  Administered Date(s) Administered   Fluad Trivalent(High Dose 65+) 01/08/2023   PNEUMOCOCCAL CONJUGATE-20 01/08/2023   Tdap 06/09/2007    Past Surgical History:  Procedure Laterality Date   COLONOSCOPY WITH PROPOFOL  N/A 03/18/2023   Procedure: COLONOSCOPY WITH PROPOFOL ;  Surgeon: Unk Corinn Skiff, MD;  Location: [facility];  Service: Endoscopy;  Laterality: N/A;   POLYPECTOMY N/A 03/18/2023   Procedure: POLYPECTOMY;  Surgeon: Unk Corinn Skiff, MD;  Location: [facility];  Service: Endoscopy;  Laterality: N/A;    Social History   Tobacco Use   Smoking status: Never    Passive exposure: Past   Smokeless tobacco: Never  Vaping Use   Vaping status: Never Used  Substance Use Topics   Alcohol use: Yes    Alcohol/week: 7.0 standard drinks of alcohol    Types: 7 Cans of beer per week    Comment: 1 beer daily   Drug use: Never    Family History  Problem Relation Age of Onset   Hypertension Mother    Diabetes Mother    Diabetes  Father    Hypertension Father    Other Father        auto accident        10/09/2023    4:08 PM 08/13/2023    8:59 AM 05/13/2023    8:12 AM 01/08/2023    9:32 AM  GAD 7 : Generalized Anxiety Score  Nervous, Anxious, on Edge 0 0 0 0  Control/stop worrying 0 0 0 0  Worry too much - different things 0 0 0 1  Trouble relaxing 0 0 0 1  Restless 0 0 0 1  Easily annoyed or irritable 0 0 0 0  Afraid - awful might happen 0 0 0 0  Total GAD 7 Score 0 0 0 3  Anxiety Difficulty Not difficult at all Not difficult at all Not difficult at all Somewhat difficult       10/09/2023    4:08 PM 08/13/2023    8:59 AM 07/09/2023     1:31 PM  Depression screen PHQ 2/9  Decreased Interest 0 0 0  Down, Depressed, Hopeless 0 0 0  PHQ - 2 Score 0 0 0  Altered sleeping 0  0  Tired, decreased energy 0  0  Change in appetite 0  0  Feeling bad or failure about yourself  0  0  Trouble concentrating 0  0  Moving slowly or fidgety/restless 0  0  Suicidal thoughts 0  0  PHQ-9 Score 0  0  Difficult doing work/chores Not difficult at all  Not difficult at all    BP Readings from Last 3 Encounters:  10/20/23 124/62  10/09/23 122/74  08/13/23 130/78    Wt Readings from Last 3 Encounters:  10/20/23 194 lb (88 kg)  10/09/23 199 lb (90.3 kg)  08/13/23 195 lb (88.5 kg)    BP 124/62   Pulse 70   Temp 98.4 F (36.9 C)   Ht 6' 1 (1.854 m)   Wt 194 lb (88 kg)   SpO2 98%   BMI 25.60 kg/m   Physical Exam Vitals and nursing note reviewed.  Constitutional:      Appearance: Normal appearance.  Cardiovascular:     Rate and Rhythm: Normal rate.  Pulmonary:     Effort: Pulmonary effort is normal.  Chest:     Chest wall: No mass.  Abdominal:     General: There is no distension.  Musculoskeletal:        General: Normal range of motion.  Skin:    General: Skin is warm and dry.     Comments: 1 cm soft mobile mass in the right axilla which is not tender and does not have overlying skin changes.  Neurological:     Mental Status: He is alert and oriented to person, place, and time.     Gait: Gait is intact.  Psychiatric:        Mood and Affect: Mood and affect normal.     Recent Labs     Component Value Date/Time   NA 137 05/13/2023 0902   NA 140 05/04/2012 2303   K 4.4 05/13/2023 0902   K 3.8 05/04/2012 2303   CL 100 05/13/2023 0902   CL 106 05/04/2012 2303   CO2 22 05/13/2023 0902   CO2 22 05/04/2012 2303   GLUCOSE 185 (H) 05/13/2023 0902   GLUCOSE 113 (H) 05/04/2012 2303   BUN 11 05/13/2023 0902   BUN 9 05/04/2012 2303   CREATININE 1.19 05/13/2023 0902   CREATININE 1.09 05/04/2012  2303   CALCIUM  9.6  05/13/2023 0902   CALCIUM  9.1 05/04/2012 2303   PROT 7.6 01/08/2023 1048   PROT 8.6 (H) 05/04/2012 2303   ALBUMIN 4.3 01/08/2023 1048   ALBUMIN 3.9 05/04/2012 2303   AST 37 01/08/2023 1048   AST 20 05/04/2012 2303   ALT 37 01/08/2023 1048   ALT 25 05/04/2012 2303   ALKPHOS 100 01/08/2023 1048   ALKPHOS 109 05/04/2012 2303   BILITOT 0.7 01/08/2023 1048   BILITOT 0.2 05/04/2012 2303   GFRNONAA >60 05/04/2012 2303   GFRAA >60 05/04/2012 2303    Lab Results  Component Value Date   WBC 4.8 01/08/2023   HGB 14.0 01/08/2023   HCT 43.5 01/08/2023   MCV 90 01/08/2023   PLT 406 01/08/2023   Lab Results  Component Value Date   HGBA1C 7.4 (A) 08/13/2023   HGBA1C 9.7 (A) 05/13/2023   HGBA1C 12.2 (H) 01/08/2023   Lab Results  Component Value Date   CHOL 197 05/13/2023   HDL 61 05/13/2023   LDLCALC 121 (H) 05/13/2023   TRIG 85 05/13/2023   CHOLHDL 3.2 05/13/2023   Lab Results  Component Value Date   TSH 2.130 01/08/2023      Assessment and Plan:  1. Sebaceous cyst of right axilla (Primary) Discussed observation vs u/s. Through shared decision-making, we will continue to observe for now given recent improvement. He was informed to reach out any time if he changes his mind.    F/u PRN   Rolan Hoyle, PA-C, DMSc, Nutritionist [facility] Primary Care and Sports Medicine [facility] [phone]

## 2023-10-29 ENCOUNTER — Other Ambulatory Visit: Admitting: Pharmacist

## 2023-10-29 ENCOUNTER — Telehealth: Payer: Self-pay | Admitting: Pharmacist

## 2023-10-29 NOTE — Progress Notes (Unsigned)
   Outreach Note  10/29/2023 Name: REBEL WILLCUTT MRN: [id number] DOB: [birth year]  Referred by: Manya Toribio SQUIBB, PA  Was unable to reach patient via telephone today and have left HIPAA compliant voicemail asking patient to return my call.    Follow Up Plan: Will collaborate with Care Guide to outreach to schedule follow up with me  Sharyle Sia, PharmD, [facility] [phone]

## 2023-11-03 ENCOUNTER — Telehealth: Payer: Self-pay | Admitting: Pharmacist

## 2023-11-03 ENCOUNTER — Other Ambulatory Visit: Payer: Self-pay | Admitting: Pharmacist

## 2023-11-03 NOTE — Progress Notes (Signed)
   Outreach Note  11/03/2023 Name: William Brown MRN: [id number] DOB: Aug 21, 1955  Referred by: Manya Toribio SQUIBB, PA  Was unable to reach patient via telephone today and unable to leave a message as voicemail is not currently setup. Outreach attempt #3  Note patient has upcoming appointment scheduled with PCP on 11/14/2023   Follow Up Plan: Will collaborate with PCP to notify of unsuccessful outreach attempts and request provider discuss with patient  Sharyle Sia, PharmD, [facility] [phone]

## 2023-11-11 ENCOUNTER — Other Ambulatory Visit: Payer: Self-pay | Admitting: Physician Assistant

## 2023-11-11 DIAGNOSIS — E1165 Type 2 diabetes mellitus with hyperglycemia: Secondary | ICD-10-CM

## 2023-11-11 DIAGNOSIS — G479 Sleep disorder, unspecified: Secondary | ICD-10-CM

## 2023-11-12 NOTE — Telephone Encounter (Signed)
 Requested Prescriptions  Pending Prescriptions Disp Refills   mirtazapine  (REMERON ) 7.5 MG tablet [Pharmacy Med Name: MIRTAZAPINE  7.5MG  TABLETS] 90 tablet 0    Sig: TAKE 1 TABLET(7.5 MG) BY MOUTH AT BEDTIME     Psychiatry: Antidepressants - mirtazapine  Passed - 11/12/2023  2:29 PM      Passed - Valid encounter within last 6 months    Recent Outpatient Visits           3 weeks ago Sebaceous cyst of right axilla   [place] [facility] at [facility], Toribio SQUIBB, [place]   1 month ago Sebaceous cyst of right axilla   [place] [facility] at [facility], [place]QU[place], [place]   3 months ago Type 2 diabetes mellitus with hyperglycemia, without long-term current use of insulin (HCC)   [place] [facility] at [facility], [place]QU[place]   6 months ago Type 2 diabetes mellitus with hyperglycemia, without long-term current use of insulin (HCC)   [place] [facility] at [facility], Toribio SQUIBB, [place]               metFORMIN  (GLUCOPHAGE ) 850 MG tablet [Pharmacy Med Name: METFORMIN  850MG  TABLETS] 180 tablet 0    Sig: TAKE 1 TABLET(850 MG) BY MOUTH TWICE DAILY WITH A MEAL     Endocrinology:  Diabetes - Biguanides Failed - 11/12/2023  2:29 PM      Failed - B12 Level in normal range and within 720 days    No results found for: VITAMINB12       Passed - Cr in normal range and within 360 days    Creatinine  Date Value Ref Range Status  05/04/2012 1.09 0.60 - 1.30 mg/dL Final   Creatinine, Ser  Date Value Ref Range Status  05/13/2023 1.19 0.76 - 1.27 mg/dL Final         Passed - HBA1C is between 0 and 7.9 and within 180 days    Hemoglobin A1C  Date Value Ref Range Status  08/13/2023 7.4 (A) 4.0 - 5.6 % Final   Hgb A1c MFr Bld  Date Value Ref Range Status  01/08/2023 12.2 (H) 4.8 - 5.6 % Final    Comment:             Prediabetes: 5.7 - 6.4          Diabetes: >6.4           Glycemic control for adults with diabetes: <7.0          Passed - eGFR in normal range and within 360 days    EGFR (African American)  Date Value Ref Range Status  05/04/2012 >60  Final   EGFR (Non-African Amer.)  Date Value Ref Range Status  05/04/2012 >60  Final    Comment:    eGFR values <1mL/min/1.73 m2 may be an indication of chronic kidney disease (CKD). Calculated eGFR is useful in patients with stable renal function. The eGFR calculation will not be reliable in acutely ill patients when serum creatinine is changing rapidly. It is not useful in  patients on dialysis. The eGFR calculation may not be applicable to patients at the low and high extremes of body sizes, pregnant women, and vegetarians.    eGFR  Date Value Ref Range Status  05/13/2023 68 >59 mL/min/1.73 Final         Passed - Valid encounter within last 6  months    Recent Outpatient Visits           3 weeks ago Sebaceous cyst of right axilla   [place] [facility] at [facility], Toribio SQUIBB, [place]   1 month ago Sebaceous cyst of right axilla   [place] [facility] at [facility], [place]QU[place], [place]   3 months ago Type 2 diabetes mellitus with hyperglycemia, without long-term current use of insulin (HCC)   [place] [facility] at [facility], [place]   6 months ago Type 2 diabetes mellitus with hyperglycemia, without long-term current use of insulin [facility])   [place] [facility] at [facility], Toribio SQUIBB, [place]              Passed - CBC within normal limits and completed in the last 12 months    WBC  Date Value Ref Range Status  01/08/2023 4.8 3.4 - 10.8 x10E3/uL Final  05/04/2012 7.8 3.8 - 10.6 x10 3/mm 3 Final   RBC  Date Value Ref Range Status  01/08/2023 4.82 4.14 - 5.80 x10E6/uL Final  05/04/2012 4.46 4.40 - 5.90 x10 6/mm 3 Final   Hemoglobin  Date Value  Ref Range Status  01/08/2023 14.0 13.0 - 17.7 g/dL Final   Hematocrit  Date Value Ref Range Status  01/08/2023 43.5 37.5 - 51.0 % Final   MCHC  Date Value Ref Range Status  01/08/2023 32.2 31.5 - 35.7 g/dL Final  96/68/7985 66.8 32.0 - 36.0 g/dL Final   [facility]  Date Value Ref Range Status  01/08/2023 29.0 26.6 - 33.0 pg Final  05/04/2012 30.7 26.0 - 34.0 pg Final   MCV  Date Value Ref Range Status  01/08/2023 90 79 - 97 fL Final  05/04/2012 93 80 - 100 fL Final   No results found for: PLTCOUNTKUC, LABPLAT, POCPLA RDW  Date Value Ref Range Status  01/08/2023 12.4 11.6 - 15.4 % Final  05/04/2012 14.7 (H) 11.5 - 14.5 % Final

## 2023-11-14 ENCOUNTER — Ambulatory Visit: Admitting: Physician Assistant

## 2023-11-14 ENCOUNTER — Encounter: Payer: Self-pay | Admitting: Physician Assistant

## 2023-11-24 NOTE — Progress Notes (Addendum)
 William Brown                                          MRN: [id number]   11/24/2023   The [facility]BCI Quality Team Specialist reviewed this patient medical record for the purposes of chart review for care gap closure. The following were reviewed: chart review for care gap closure-kidney health evaluation for diabetes:eGFR  and uACR. Patient needs lab appt to have these completed for gap closure.  02/03/2024- UACR ORDERED BUT NOT COMPLETED.    VBCI Quality Team

## 2023-12-05 ENCOUNTER — Ambulatory Visit (INDEPENDENT_AMBULATORY_CARE_PROVIDER_SITE_OTHER): Admitting: Physician Assistant

## 2023-12-05 ENCOUNTER — Encounter: Payer: Self-pay | Admitting: Physician Assistant

## 2023-12-05 VITALS — BP 122/76 | HR 80 | Temp 98.0°F | Ht 73.0 in | Wt 193.0 lb

## 2023-12-05 DIAGNOSIS — R351 Nocturia: Secondary | ICD-10-CM

## 2023-12-05 DIAGNOSIS — E1169 Type 2 diabetes mellitus with other specified complication: Secondary | ICD-10-CM | POA: Diagnosis not present

## 2023-12-05 DIAGNOSIS — Z7984 Long term (current) use of oral hypoglycemic drugs: Secondary | ICD-10-CM | POA: Diagnosis not present

## 2023-12-05 DIAGNOSIS — E1165 Type 2 diabetes mellitus with hyperglycemia: Secondary | ICD-10-CM | POA: Diagnosis not present

## 2023-12-05 DIAGNOSIS — R972 Elevated prostate specific antigen [PSA]: Secondary | ICD-10-CM

## 2023-12-05 DIAGNOSIS — R0609 Other forms of dyspnea: Secondary | ICD-10-CM

## 2023-12-05 DIAGNOSIS — N401 Enlarged prostate with lower urinary tract symptoms: Secondary | ICD-10-CM

## 2023-12-05 DIAGNOSIS — E785 Hyperlipidemia, unspecified: Secondary | ICD-10-CM

## 2023-12-05 LAB — POCT GLYCOSYLATED HEMOGLOBIN (HGB A1C): Hemoglobin A1C: 6.6 % — AB (ref 4.0–5.6)

## 2023-12-05 NOTE — Assessment & Plan Note (Signed)
 Continue tamsulosin . Fluctuating PSA <10. Due for repeat on today's labs.

## 2023-12-05 NOTE — Assessment & Plan Note (Signed)
 Continue rosuvastatin , check fasting lipids today.

## 2023-12-05 NOTE — Telephone Encounter (Signed)
 He said his preferred number is [phone] and he would not mind continued pharmacy check-ins. Thanks!

## 2023-12-05 NOTE — Progress Notes (Signed)
 Date:  12/05/2023   Name:  William Brown   DOB:  20-Nov-1955   MRN:  [id number]   Chief Complaint: Diabetes  HPI  William Brown returns for 35-month follow-up on chronic conditions including DM2, HLD, HTN.  In April I had increased his metformin  to 850 mg twice daily and he had an impressive drop in A1c from 12.2% in December to 7.4% in July.  He is due for repeat A1c and microalbumin today.  Also plan to check fasting lipids, currently on rosuvastatin .   We have also been watching a suspected sebaceous cyst of the right axilla which appeared in August/September.  It has since resolved completely.   Due to chronic DOE, I ordered echo for him in Dec 2024. It was scheduled but patient missed his appointment for echo 05/21/23.  Medication list has been reviewed and updated.  Current Meds  Medication Sig   Accu-Chek Softclix Lancets lancets Use to check blood sugar up to twice daily   Blood Glucose Monitoring Suppl (ACCU-CHEK GUIDE ME) w/Device KIT Use to check blood sugar up to twice daily   glucose blood (ACCU-CHEK GUIDE TEST) test strip Use to check blood sugar up to twice daily   metFORMIN  (GLUCOPHAGE ) 850 MG tablet TAKE 1 TABLET(850 MG) BY MOUTH TWICE DAILY WITH A MEAL   mirtazapine  (REMERON ) 7.5 MG tablet TAKE 1 TABLET(7.5 MG) BY MOUTH AT BEDTIME   rosuvastatin  (CRESTOR ) 5 MG tablet Take 1 tablet (5 mg total) by mouth daily.   sildenafil  (VIAGRA ) 50 MG tablet Take 0.5-1 tablets (25-50 mg total) by mouth daily as needed for erectile dysfunction. Do not take more than 1 tablet within 24h.   tamsulosin  (FLOMAX ) 0.4 MG CAPS capsule Take 1 capsule (0.4 mg total) by mouth daily.     Review of Systems  Patient Active Problem List   Diagnosis Date Noted   Erectile dysfunction 05/13/2023   Long term current use of oral hypoglycemic drug 05/13/2023   Screening for colon cancer 03/18/2023   Polyp of descending colon 03/18/2023   Polyp of ascending colon 03/18/2023   Elevated PSA, less than  10 ng/ml 01/09/2023   Hyperlipidemia associated with type 2 diabetes mellitus (HCC) 01/09/2023   Type 2 diabetes mellitus with hyperglycemia (HCC) 01/09/2023   Benign prostatic hyperplasia with nocturia 01/08/2023   Premature atrial contractions 01/08/2023   Dyspnea on exertion 01/08/2023   Primary hypertension 01/08/2023   Sleeping difficulty 01/08/2023    No Known Allergies  Immunization History  Administered Date(s) Administered   Fluad Trivalent(High Dose 65+) 01/08/2023   PNEUMOCOCCAL CONJUGATE-20 01/08/2023   Tdap 06/09/2007    Past Surgical History:  Procedure Laterality Date   COLONOSCOPY WITH PROPOFOL  N/A 03/18/2023   Procedure: COLONOSCOPY WITH PROPOFOL ;  Surgeon: Unk William Skiff, MD;  Location: [facility];  Service: Endoscopy;  Laterality: N/A;   POLYPECTOMY N/A 03/18/2023   Procedure: POLYPECTOMY;  Surgeon: Unk William Skiff, MD;  Location: [facility];  Service: Endoscopy;  Laterality: N/A;    Social History   Tobacco Use   Smoking status: Never    Passive exposure: Past   Smokeless tobacco: Never  Vaping Use   Vaping status: Never Used  Substance Use Topics   Alcohol use: Yes    Alcohol/week: 7.0 standard drinks of alcohol    Types: 7 Cans of beer per week    Comment: 1 beer daily   Drug use: Never    Family History  Problem Relation Age of Onset  Hypertension Mother    Diabetes Mother    Diabetes Father    Hypertension Father    Other Father        auto accident        12/05/2023    8:22 AM 10/09/2023    4:08 PM 08/13/2023    8:59 AM 05/13/2023    8:12 AM  GAD 7 : Generalized Anxiety Score  Nervous, Anxious, on Edge 0 0 0 0  Control/stop worrying 0 0 0 0  Worry too much - different things 0 0 0 0  Trouble relaxing 0 0 0 0  Restless 0 0 0 0  Easily annoyed or irritable 0 0 0 0  Afraid - awful might happen 0 0 0 0  Total GAD 7 Score 0 0 0 0  Anxiety Difficulty Not difficult at all Not difficult at all Not difficult at  all Not difficult at all       12/05/2023    8:22 AM 10/09/2023    4:08 PM 08/13/2023    8:59 AM  Depression screen PHQ 2/9  Decreased Interest 0 0 0  Down, Depressed, Hopeless 0 0 0  PHQ - 2 Score 0 0 0  Altered sleeping  0   Tired, decreased energy  0   Change in appetite  0   Feeling bad or failure about yourself   0   Trouble concentrating  0   Moving slowly or fidgety/restless  0   Suicidal thoughts  0   PHQ-9 Score  0   Difficult doing work/chores  Not difficult at all     BP Readings from Last 3 Encounters:  12/05/23 122/76  10/20/23 124/62  10/09/23 122/74    Wt Readings from Last 3 Encounters:  12/05/23 193 lb (87.5 kg)  10/20/23 194 lb (88 kg)  10/09/23 199 lb (90.3 kg)    BP 122/76   Pulse 80   Temp 98 F (36.7 C)   Ht 6' 1 (1.854 m)   Wt 193 lb (87.5 kg)   SpO2 97%   BMI 25.46 kg/m   Physical Exam Vitals and nursing note reviewed.  Constitutional:      Appearance: Normal appearance.  Neck:     Vascular: No carotid bruit.  Cardiovascular:     Rate and Rhythm: Normal rate.  Pulmonary:     Effort: Pulmonary effort is normal.  Abdominal:     General: There is no distension.  Musculoskeletal:        General: Normal range of motion.  Skin:    General: Skin is warm and dry.  Neurological:     Mental Status: He is alert and oriented to person, place, and time.     Gait: Gait is intact.  Psychiatric:        Mood and Affect: Mood and affect normal.     Recent Labs     Component Value Date/Time   NA 137 05/13/2023 0902   NA 140 05/04/2012 2303   K 4.4 05/13/2023 0902   K 3.8 05/04/2012 2303   CL 100 05/13/2023 0902   CL 106 05/04/2012 2303   CO2 22 05/13/2023 0902   CO2 22 05/04/2012 2303   GLUCOSE 185 (H) 05/13/2023 0902   GLUCOSE 113 (H) 05/04/2012 2303   BUN 11 05/13/2023 0902   BUN 9 05/04/2012 2303   CREATININE 1.19 05/13/2023 0902   CREATININE 1.09 05/04/2012 2303   CALCIUM  9.6 05/13/2023 0902   CALCIUM  9.1 05/04/2012 2303  PROT 7.6 01/08/2023 1048   PROT 8.6 (H) 05/04/2012 2303   ALBUMIN 4.3 01/08/2023 1048   ALBUMIN 3.9 05/04/2012 2303   AST 37 01/08/2023 1048   AST 20 05/04/2012 2303   ALT 37 01/08/2023 1048   ALT 25 05/04/2012 2303   ALKPHOS 100 01/08/2023 1048   ALKPHOS 109 05/04/2012 2303   BILITOT 0.7 01/08/2023 1048   BILITOT 0.2 05/04/2012 2303   GFRNONAA >60 05/04/2012 2303   GFRAA >60 05/04/2012 2303    Lab Results  Component Value Date   WBC 4.8 01/08/2023   HGB 14.0 01/08/2023   HCT 43.5 01/08/2023   MCV 90 01/08/2023   PLT 406 01/08/2023   Lab Results  Component Value Date   HGBA1C 6.6 (A) 12/05/2023   HGBA1C 7.4 (A) 08/13/2023   HGBA1C 9.7 (A) 05/13/2023   Lab Results  Component Value Date   CHOL 197 05/13/2023   HDL 61 05/13/2023   LDLCALC 121 (H) 05/13/2023   TRIG 85 05/13/2023   CHOLHDL 3.2 05/13/2023   Lab Results  Component Value Date   TSH 2.130 01/08/2023      Assessment and Plan:  Type 2 diabetes mellitus with hyperglycemia, without long-term current use of insulin (HCC) Assessment & Plan: Patient continues to do well with metformin .  A1c 6.6% reflecting ongoing improvement over the last year, congratulated on this.  Continue metformin  monotherapy as prescribed, encouraged continued strength training.   Orders: -     Comprehensive metabolic panel with GFR -     CBC with Differential/Platelet -     POCT glycosylated hemoglobin (Hb A1C) -     Microalbumin / creatinine urine ratio -     Lipid panel  Long term current use of oral hypoglycemic drug Assessment & Plan: Continue metformin  850 mg BID   Hyperlipidemia associated with type 2 diabetes mellitus (HCC) Assessment & Plan: Continue rosuvastatin , check fasting lipids today.   Orders: -     Lipid panel  Elevated PSA, less than 10 ng/ml Assessment & Plan: Fluctuating PSA <10. Due for repeat on today's labs.   Orders: -     PSA  Benign prostatic hyperplasia with nocturia Assessment &  Plan: Continue tamsulosin . Fluctuating PSA <10. Due for repeat on today's labs.    Orders: -     PSA  Dyspnea on exertion Assessment & Plan: Patient given contact info for Outpatient Imaging to reschedule echo.       Return in about 3 months (around 03/06/2024) for OV f/u DM2.   I personally spent a total of 31 minutes in the care of the patient today including preparing to see the patient, performing a medically appropriate exam/evaluation, counseling and educating, placing orders, documenting clinical information in the EHR, and coordinating care.   Rolan Hoyle, PA-C, DMSc, Nutritionist [facility] [phone]

## 2023-12-05 NOTE — Assessment & Plan Note (Signed)
 Continue metformin  850 mg BID.

## 2023-12-05 NOTE — Assessment & Plan Note (Signed)
 Fluctuating PSA <10. Due for repeat on today's labs.

## 2023-12-05 NOTE — Assessment & Plan Note (Signed)
 Patient given contact info for Outpatient Imaging to reschedule echo.

## 2023-12-05 NOTE — Assessment & Plan Note (Signed)
 Patient continues to do well with metformin .  A1c 6.6% reflecting ongoing improvement over the last year, congratulated on this.  Continue metformin  monotherapy as prescribed, encouraged continued strength training.

## 2023-12-11 ENCOUNTER — Telehealth: Payer: Self-pay

## 2023-12-11 ENCOUNTER — Ambulatory Visit: Payer: Self-pay

## 2023-12-11 NOTE — Telephone Encounter (Signed)
Called pt could not leave VM. VM full.  KP

## 2023-12-11 NOTE — Telephone Encounter (Signed)
 FYI Only or Action Required?: Action required by provider: update on patient condition.Appt. Scheduled for 11/14  Patient was last seen in primary care on 12/05/2023 by Manya Toribio SQUIBB, PA.  Called Nurse Triage reporting Shortness of Breath.  Symptoms began several months ago.  Interventions attempted: Nothing.  Symptoms are: unchanged.  Triage Disposition: See PCP Within 2 Weeks  Patient/caregiver understands and will follow disposition?: Yes  **See note below**            Copied from CRM #[id number]. Topic: Clinical - Red Word Triage >> Dec 11, 2023  2:13 PM William Brown wrote: Red Word that prompted transfer to Nurse Triage: Patient calling, states he is worsening having shortness of breath. No further symptoms at present.   He reports that he was supposed to be seeing a cardiologist, but has not heard from anyone, and tried to call to schedule an appointment, but per cardiology advised patient that he would need a referral prior to being seen. Reason for Disposition  [1] MILD longstanding difficulty breathing (e.g., minimal/no SOB at rest, SOB with walking, pulse < 100) AND [2] SAME as normal  Answer Assessment - Initial Assessment Questions 1. RESPIRATORY STATUS: Describe your breathing? (e.g., wheezing, shortness of breath, unable to speak, severe coughing)      SOB on exertion   2. ONSET: When did this breathing problem begin?      X 6 months   3. PATTERN Does the difficult breathing come and go, or has it been constant since it started?      Intermittent   4. SEVERITY: How bad is your breathing? (e.g., mild, moderate, severe)      Mild   5. RECURRENT SYMPTOM: Have you had difficulty breathing before? If Yes, ask: When was the last time? and What happened that time?      No   6. CARDIAC HISTORY: Do you have any history of heart disease? (e.g., heart attack, angina, bypass surgery, angioplasty)      No   7. LUNG HISTORY: Do you have any history  of lung disease?  (e.g., pulmonary embolus, asthma, emphysema)     No   8. CAUSE: What do you think is causing the breathing problem?      Unsure   9. OTHER SYMPTOMS: Do you have any other symptoms? (e.g., chest pain, cough, dizziness, fever, runny nose)  No    Patient was seen in the office on 10/31 for Type 2 diabetes .He reports that he was supposed to be seeing a cardiologist, but has not heard from anyone, and tried to call to schedule an appointment, but per cardiology advised patient that he would need a referral prior to being seen. Pt. Declined an in office visit and specifically requested to be seen on Friday 11/14. He also is requesting the cardiology referral be placed. He agreed to call back if SOB worsens.  Protocols used: Breathing Difficulty-A-AH

## 2023-12-11 NOTE — Telephone Encounter (Signed)
 Patient was seen in the office on 10/31 for Type 2 diabetes .He reports that he was supposed to be seeing a cardiologist, but has not heard from anyone, and tried to call to schedule an appointment, but per cardiology advised patient that he would need a referral prior to being seen. Pt. Declined an in office visit and specifically requested to be seen on Friday 11/14. He also is requesting the cardiology referral be placed. He agreed to call back if SOB worsens.

## 2023-12-11 NOTE — Telephone Encounter (Signed)
 Duplicate message. Another message has been sent to [facility].  KP

## 2023-12-11 NOTE — Telephone Encounter (Signed)
 Copied from CRM #[id number]. Topic: Referral - Question >> Dec 11, 2023  2:10 PM Fonda T wrote: Reason for CRM: Patient calling, states cardiology is requesting information for referral.  Patient was unsure of what cardiologist needed. He thinks they were asking for a new referral.   Patient can be reached back at [phone].

## 2023-12-12 NOTE — Telephone Encounter (Signed)
Called pt could not leave VM. VM full.  KP

## 2023-12-18 ENCOUNTER — Other Ambulatory Visit: Payer: Self-pay | Admitting: Physician Assistant

## 2023-12-18 ENCOUNTER — Telehealth: Payer: Self-pay | Admitting: Physician Assistant

## 2023-12-18 DIAGNOSIS — R0609 Other forms of dyspnea: Secondary | ICD-10-CM

## 2023-12-18 NOTE — Addendum Note (Signed)
 Addended by: MANYA TORIBIO SQUIBB on: 12/18/2023 02:27 PM   Modules accepted: Orders

## 2023-12-18 NOTE — Telephone Encounter (Signed)
 I was finally able to reach [place]. I told him that I had some extra time today and went ahead and called around, scheduled his echo and cardiology appointments for him. I gave him the info as follows:  Tues 12/30/23 @ 11am [facility] for echo Tues 01/06/24 @ 10:45 am Cardiology consult [place] location [address] [place] [address] Custovic

## 2023-12-19 ENCOUNTER — Ambulatory Visit: Admitting: Physician Assistant

## 2023-12-30 ENCOUNTER — Ambulatory Visit
Admission: RE | Admit: 2023-12-30 | Discharge: 2023-12-30 | Disposition: A | Discharge status: Home / Self Care | Source: Ambulatory Visit | Attending: Physician Assistant | Admitting: Physician Assistant

## 2023-12-30 DIAGNOSIS — R0609 Other forms of dyspnea: Secondary | ICD-10-CM | POA: Insufficient documentation

## 2023-12-30 DIAGNOSIS — I083 Combined rheumatic disorders of mitral, aortic and tricuspid valves: Secondary | ICD-10-CM | POA: Diagnosis not present

## 2023-12-30 DIAGNOSIS — I10 Essential (primary) hypertension: Secondary | ICD-10-CM | POA: Diagnosis not present

## 2023-12-30 DIAGNOSIS — E119 Type 2 diabetes mellitus without complications: Secondary | ICD-10-CM | POA: Diagnosis not present

## 2023-12-30 LAB — ECHOCARDIOGRAM COMPLETE
AR max vel: 2.55 cm2
AV Area VTI: 2.75 cm2
AV Area mean vel: 2.59 cm2
AV Mean grad: 2 mmHg
AV Peak grad: 3.5 mmHg
Ao pk vel: 0.93 m/s
Area-P 1/2: 3.06 cm2
MV VTI: 2.24 cm2
S' Lateral: 3.6 cm

## 2023-12-30 NOTE — Progress Notes (Signed)
*  PRELIMINARY RESULTS* Echocardiogram 2D Echocardiogram has been performed.  Floydene Harder 12/30/2023, 11:24 AM

## 2023-12-30 NOTE — Progress Notes (Signed)
*  PRELIMINARY RESULTS* Echocardiogram 2D Echocardiogram has been performed.  William Brown 12/30/2023, 11:24 AM

## 2024-01-05 ENCOUNTER — Ambulatory Visit: Payer: Self-pay | Admitting: Physician Assistant

## 2024-01-07 ENCOUNTER — Other Ambulatory Visit: Payer: Self-pay | Admitting: Pharmacist

## 2024-01-07 DIAGNOSIS — E1165 Type 2 diabetes mellitus with hyperglycemia: Secondary | ICD-10-CM

## 2024-01-07 NOTE — Patient Instructions (Signed)
 Goals Addressed             This Visit's Progress    Pharmacy Goals       The goal A1c is less than 7%. This is the best way to reduce the risk of the long term complications of diabetes, including heart disease, kidney disease, eye disease, strokes, and nerve damage. An A1c of less than 7% corresponds with fasting sugars less than 130 and 2 hour after meal sugars less than 180.   Our goal bad cholesterol, or LDL, is less than 70 . This is why it is important to continue taking your rosuvastatin   Arthur Lash, PharmD, [facility] [phone]

## 2024-01-07 NOTE — Progress Notes (Signed)
 01/07/2024 Name: William Brown MRN: [id number] DOB: 1955/02/28  Chief Complaint  Patient presents with   Medication Management    William Brown is a 68 y.o. year old male who presented for a telephone visit.   They were referred to the pharmacist by a quality report for assistance in managing diabetes.      Subjective:   Care Team: Primary Care Provider: Manya Toribio SQUIBB, PA; Next Scheduled Visit: 03/08/2024 Cardiologist: Dewane Shiner, DO; Next Scheduled Visit: 02/03/2024  Note Lexiscan stress test scheduled for 12/9  Medication Access/Adherence  Current Pharmacy:  [id number] - [place], [place] - [address] [place] [address] Phone: [phone] Fax: [phone]   Patient reports affordability concerns with their medications: No  Patient reports access/transportation concerns to their pharmacy: No  Patient reports adherence concerns with their medications:  No    Patient confirms plans to return to Cardiology on 12/9 as scheduled for stress test   Diabetes:   Current medications: metformin  850 mg twice daily    Current glucose readings: morning fasting readings mostly ranging: 80s-90s Using Accu-Chek Guide meter; testing three times week   Patient shares that he has made significant changes to improve his diet, including reducing consumption of sweets and stopped drinking soda (now drinking water  instead)  However, note patient currently does not have any teeth, which is limiting his dietary options. Plans to have dentures by the end of December  Current dietary habits: - Breakfast: Eggs and grits and glass of OJ - Lunch: often skips - Supper: creamed potatoes or baked potato and sometimes green beans - Snacks: sometimes drinks Boost or Ensure - Drinks: water ; sometimes a glass of milk or OJ   Statin therapy: rosuvastatin  5 mg daily   Current physical activity: stays active  throughout the day walking at part-time job and home      Objective:  Lab Results  Component Value Date   HGBA1C 6.6 (A) 12/05/2023    Lab Results  Component Value Date   CREATININE 1.19 05/13/2023   BUN 11 05/13/2023   NA 137 05/13/2023   K 4.4 05/13/2023   CL 100 05/13/2023   CO2 22 05/13/2023    Lab Results  Component Value Date   CHOL 197 05/13/2023   HDL 61 05/13/2023   LDLCALC 121 (H) 05/13/2023   TRIG 85 05/13/2023   CHOLHDL 3.2 05/13/2023   BP Readings from Last 3 Encounters:  12/05/23 122/76  10/20/23 124/62  10/09/23 122/74   Pulse Readings from Last 3 Encounters:  12/05/23 80  10/20/23 70  10/09/23 71     Medications Reviewed Today     Reviewed by Alana Sharyle LABOR, RPH-CPP (Pharmacist) on 01/07/24 at 0908  Med List Status: <None>   Medication Order Taking? Sig Documenting Provider Last Dose Status Informant  Accu-Chek Softclix Lancets lancets [id number]  Use to check blood sugar up to twice daily Manya Toribio SQUIBB, PA  Active   Blood Glucose Monitoring Suppl (ACCU-CHEK GUIDE ME) w/Device KIT [id number]  Use to check blood sugar up to twice daily Manya Toribio SQUIBB, PA  Active   glucose blood (ACCU-CHEK GUIDE TEST) test strip [id number]  Use to check blood sugar up to twice daily Manya Toribio SQUIBB, [place]  Active   metFORMIN  (GLUCOPHAGE ) 850 MG tablet [id number] Yes TAKE 1 TABLET(850 MG) BY MOUTH TWICE DAILY WITH A MEAL Manya Toribio SQUIBB, PA  Active   mirtazapine  (REMERON ) 7.5 MG tablet [id number]  TAKE 1 TABLET(7.5 MG) BY MOUTH AT BEDTIME Manya Toribio SQUIBB, PA  Active   rosuvastatin  (CRESTOR ) 5 MG tablet [id number] Yes Take 1 tablet (5 mg total) by mouth daily. Manya Toribio SQUIBB, PA  Active   sildenafil  (VIAGRA ) 50 MG tablet [id number]  Take 0.5-1 tablets (25-50 mg total) by mouth daily as needed for erectile dysfunction. Do not take more than 1 tablet within 24h. Manya Toribio SQUIBB, PA  Active   tamsulosin  (FLOMAX ) 0.4 MG CAPS capsule [id number]  Take 1 capsule  (0.4 mg total) by mouth daily. Manya Toribio SQUIBB, PA  Active               Assessment/Plan:   Diabetes: - Controlled - Reviewed long term cardiovascular and renal outcomes of uncontrolled blood sugar - Reviewed goal A1c, goal fasting, and goal 2 hour post prandial glucose - Reviewed dietary modifications including importance of having regular well-balanced meals and snacks throughout the day, while controlling carbohydrate portion sizes             Advise patient to avoid skipping meals             Encourage patient to increase consumption of non-starchy vegetables - Recommend to continue to check glucose, keep log of results and have this record to review at upcoming medical appointments. Patient to contact provider office sooner if needed for readings outside of established parameters or symptoms - Recommend patient continue on statin therapy for ASCVD risk reduction     Follow Up Plan: Clinical Pharmacist will follow up with patient by telephone on 02/11/2024 at 9:30 AM     Sharyle Sia, PharmD, [facility] [phone]

## 2024-02-09 ENCOUNTER — Other Ambulatory Visit: Payer: Self-pay | Admitting: Physician Assistant

## 2024-02-09 DIAGNOSIS — G479 Sleep disorder, unspecified: Secondary | ICD-10-CM

## 2024-02-09 DIAGNOSIS — I10 Essential (primary) hypertension: Secondary | ICD-10-CM

## 2024-02-09 DIAGNOSIS — N401 Enlarged prostate with lower urinary tract symptoms: Secondary | ICD-10-CM

## 2024-02-10 NOTE — Telephone Encounter (Signed)
 Requested Prescriptions  Pending Prescriptions Disp Refills   mirtazapine  (REMERON ) 7.5 MG tablet [Pharmacy Med Name: MIRTAZAPINE  7.5MG  TABLETS] 90 tablet 1    Sig: TAKE 1 TABLET(7.5 MG) BY MOUTH AT BEDTIME     Psychiatry: Antidepressants - mirtazapine  Passed - 02/10/2024 12:24 PM      Passed - Valid encounter within last 6 months    Recent Outpatient Visits           2 months ago Type 2 diabetes mellitus with hyperglycemia, without long-term current use of insulin (HCC)   [place] [facility] at [facility], Toribio SQUIBB, [place]   3 months ago Sebaceous cyst of right axilla   [facility] & [facility] Medicine at [facility], Toribio SQUIBB, [place]   4 months ago Sebaceous cyst of right axilla   [facility] at [facility], Toribio SQUIBB, [place]   6 months ago Type 2 diabetes mellitus with hyperglycemia, without long-term current use of insulin [facility])   [place] [facility] at [facility], Toribio SQUIBB, [place]   9 months ago Type 2 diabetes mellitus with hyperglycemia, without long-term current use of insulin [facility])   [facility] Primary Care & Sports Medicine at [facility], Toribio SQUIBB, [place]               tamsulosin  (FLOMAX ) 0.4 MG CAPS capsule [Pharmacy Med Name: TAMSULOSIN  0.4MG  CAPSULES] 90 capsule 0    Sig: TAKE 1 CAPSULE(0.4 MG) BY MOUTH DAILY     Urology: Alpha-Adrenergic Blocker Failed - 02/10/2024 12:24 PM      Failed - PSA in normal range and within 360 days    Prostate Specific Ag, Serum  Date Value Ref Range Status  05/13/2023 5.5 (H) 0.0 - 4.0 ng/mL Final    Comment:    Roche ECLIA methodology. According to the American Urological Association, Serum PSA should decrease and remain at undetectable levels after radical prostatectomy. The AUA defines biochemical recurrence as an initial PSA value 0.2 ng/mL or greater followed by a subsequent confirmatory PSA value  0.2 ng/mL or greater. Values obtained with different assay methods or kits cannot be used interchangeably. Results cannot be interpreted as absolute evidence of the presence or absence of malignant disease.          Passed - Last BP in normal range    BP Readings from Last 1 Encounters:  12/05/23 122/76         Passed - Valid encounter within last 12 months    Recent Outpatient Visits           2 months ago Type 2 diabetes mellitus with hyperglycemia, without long-term current use of insulin (HCC)   [place] [facility] at [facility], Toribio SQUIBB, [place]   3 months ago Sebaceous cyst of right axilla   [facility] at [facility], Toribio SQUIBB, [place]   4 months ago Sebaceous cyst of right axilla   [facility] at [facility], Toribio SQUIBB, [place]   6 months ago Type 2 diabetes mellitus with hyperglycemia, without long-term current use of insulin [facility])   [place] [facility] at [facility], Toribio SQUIBB, [place]   9 months ago Type 2 diabetes mellitus with hyperglycemia, without long-term current use of insulin (HCC)   [place] [facility]  [facility] & [facility] Medicine at [facility], Toribio SQUIBB, [place]

## 2024-02-11 ENCOUNTER — Other Ambulatory Visit: Payer: Self-pay | Admitting: Physician Assistant

## 2024-02-11 ENCOUNTER — Other Ambulatory Visit: Payer: Self-pay | Admitting: Pharmacist

## 2024-02-11 DIAGNOSIS — N529 Male erectile dysfunction, unspecified: Secondary | ICD-10-CM

## 2024-02-11 DIAGNOSIS — E1165 Type 2 diabetes mellitus with hyperglycemia: Secondary | ICD-10-CM

## 2024-02-11 DIAGNOSIS — G479 Sleep disorder, unspecified: Secondary | ICD-10-CM

## 2024-02-11 DIAGNOSIS — E1169 Type 2 diabetes mellitus with other specified complication: Secondary | ICD-10-CM

## 2024-02-11 DIAGNOSIS — I10 Essential (primary) hypertension: Secondary | ICD-10-CM

## 2024-02-11 DIAGNOSIS — N401 Enlarged prostate with lower urinary tract symptoms: Secondary | ICD-10-CM

## 2024-02-11 NOTE — Progress Notes (Signed)
 Complex Care Management Care Guide Note  02/11/2024 Name: William Brown MRN: [id number] DOB: 08-31-1955  William Brown is a 69 y.o. year old male who is a primary care patient of Manya Toribio SQUIBB, [place] and is actively engaged with the care management team. I reached out to William Brown by phone today to assist with re-scheduling  with the Pharmacist.  Follow up plan: Unsuccessful telephone outreach attempt made. A HIPAA compliant phone message was left for the patient providing contact information and requesting a return call.  Jeoffrey Buffalo , RMA     [facility]  [facility], [facility] Guide  Direct Dial: [phone]  Website: [email or web address]

## 2024-02-16 NOTE — Telephone Encounter (Signed)
 Copied from CRM #[id number]. Topic: Clinical - Medication Refill >> Feb 16, 2024 11:41 AM Nathanel BROCKS wrote: Medication:  mirtazapine  (REMERON ) 7.5 MG tablet rosuvastatin  (CRESTOR ) 5 MG tablet sildenafil  (VIAGRA ) 50 MG tablet tamsulosin  (FLOMAX ) 0.4 MG CAPS capsule Accu-Chek Softclix Lancets lancets glucose blood (ACCU-CHEK GUIDE TEST) test strip  Has the patient contacted their pharmacy? No (Agent: If no, request that the patient contact the pharmacy for the refill. If patient does not wish to contact the pharmacy document the reason why and proceed with request.) (Agent: If yes, when and what did the pharmacy advise?)  This is the patient's preferred pharmacy:  [facility] #[id number] - [place], [place] - [address] [place] [address] Phone: [phone] Fax: [phone]  Is this the correct pharmacy for this prescription? Yes If no, delete pharmacy and type the correct one.   Has the prescription been filled recently? Yes  Is the patient out of the medication? Yes  Has the patient been seen for an appointment in the last year OR does the patient have an upcoming appointment? Yes  Can we respond through MyChart? No  Agent: Please be advised that Rx refills may take up to 3 business days. We ask that you follow-up with your pharmacy.

## 2024-02-16 NOTE — Telephone Encounter (Signed)
 Requested medications are due for refill today.  yes  Requested medications are on the active medications list.  yes  Last refill. 05/13/2023 #10 0 rf  Future visit scheduled.   yes  Notes to clinic.  Expired labs    Requested Prescriptions  Pending Prescriptions Disp Refills   sildenafil  (VIAGRA ) 50 MG tablet 10 tablet 0    Sig: Take 0.5-1 tablets (25-50 mg total) by mouth daily as needed for erectile dysfunction. Do not take more than 1 tablet within 24h.     Urology: Erectile Dysfunction Agents Failed - 02/16/2024  3:58 PM      Failed - AST in normal range and within 360 days    AST  Date Value Ref Range Status  01/08/2023 37 0 - 40 IU/L Final   SGOT(AST)  Date Value Ref Range Status  05/04/2012 20 15 - 37 Unit/L Final         Failed - ALT in normal range and within 360 days    ALT  Date Value Ref Range Status  01/08/2023 37 0 - 44 IU/L Final   SGPT (ALT)  Date Value Ref Range Status  05/04/2012 25 12 - 78 U/L Final         Passed - Last BP in normal range    BP Readings from Last 1 Encounters:  12/05/23 122/76         Passed - Valid encounter within last 12 months    Recent Outpatient Visits           2 months ago Type 2 diabetes mellitus with hyperglycemia, without long-term current use of insulin (HCC)   [place] [facility] at [facility], Toribio SQUIBB, [place]   3 months ago Sebaceous cyst of right axilla   [facility] at [facility], Toribio SQUIBB, [place]   4 months ago Sebaceous cyst of right axilla   [facility] at [facility], Toribio SQUIBB, [place]   6 months ago Type 2 diabetes mellitus with hyperglycemia, without long-term current use of insulin [facility])   [place] [facility] at [facility], Toribio SQUIBB, [place]   9 months ago Type 2 diabetes mellitus with hyperglycemia, without long-term current use of insulin [facility])   [facility]  Primary Care & Sports Medicine at [facility], Toribio SQUIBB, [place]              Refused Prescriptions Disp Refills   mirtazapine  (REMERON ) 7.5 MG tablet 90 tablet 1     Psychiatry: Antidepressants - mirtazapine  Passed - 02/16/2024  3:58 PM      Passed - Valid encounter within last 6 months    Recent Outpatient Visits           2 months ago Type 2 diabetes mellitus with hyperglycemia, without long-term current use of insulin (HCC)   [place] [facility] at [facility], Toribio SQUIBB, [place]   3 months ago Sebaceous cyst of right axilla   [facility] Primary Care & Sports Medicine at [facility], Toribio SQUIBB, [place]   4 months ago Sebaceous cyst of right axilla   [facility] Primary Care & Sports Medicine at [facility], Toribio SQUIBB, [place]   6 months ago Type 2 diabetes mellitus with hyperglycemia, without long-term current use of insulin [facility])   [place] [facility] Sports Medicine at  [facility] Manya Toribio SHA[place], [place]   9 months ago Type 2 diabetes mellitus with hyperglycemia, without long-term current use of insulin [facility])   [place] [facility] at [facility], Toribio SHAUNNA, [place]               rosuvastatin  (CRESTOR ) 5 MG tablet 90 tablet 3    Sig: Take 1 tablet (5 mg total) by mouth daily.     Cardiovascular:  Antilipid - Statins 2 Failed - 02/16/2024  3:58 PM      Failed - Lipid Panel in normal range within the last 12 months    Cholesterol, Total  Date Value Ref Range Status  05/13/2023 197 100 - 199 mg/dL Final   LDL Chol Calc (NIH)  Date Value Ref Range Status  05/13/2023 121 (H) 0 - 99 mg/dL Final   HDL  Date Value Ref Range Status  05/13/2023 61 >39 mg/dL Final   Triglycerides  Date Value Ref Range Status  05/13/2023 85 0 - 149 mg/dL Final         Passed - Cr in normal range and within 360 days    Creatinine  Date Value Ref Range Status  05/04/2012 1.09 0.60 - 1.30  mg/dL Final   Creatinine, Ser  Date Value Ref Range Status  05/13/2023 1.19 0.76 - 1.27 mg/dL Final         Passed - Patient is not pregnant      Passed - Valid encounter within last 12 months    Recent Outpatient Visits           2 months ago Type 2 diabetes mellitus with hyperglycemia, without long-term current use of insulin (HCC)   [place] [facility] at [facility], Toribio SHAUNNA, [place]   3 months ago Sebaceous cyst of right axilla   [facility] Primary Care & Sports Medicine at [facility], Toribio SHAUNNA, [place]   4 months ago Sebaceous cyst of right axilla   [facility] Primary Care & Sports Medicine at [facility], Toribio SHAUNNA, [place]   6 months ago Type 2 diabetes mellitus with hyperglycemia, without long-term current use of insulin [facility])   [place] Primary Care & Sports Medicine at [facility], Toribio SHAUNNA, [place]   9 months ago Type 2 diabetes mellitus with hyperglycemia, without long-term current use of insulin [facility])   [facility] Primary Care & Sports Medicine at [facility], Toribio SHAUNNA, [place]               tamsulosin  (FLOMAX ) 0.4 MG CAPS capsule 90 capsule 0     Urology: Alpha-Adrenergic Blocker Failed - 02/16/2024  3:58 PM      Failed - PSA in normal range and within 360 days    Prostate Specific Ag, Serum  Date Value Ref Range Status  05/13/2023 5.5 (H) 0.0 - 4.0 ng/mL Final    Comment:    Roche ECLIA methodology. According to the American Urological Association, Serum PSA should decrease and remain at undetectable levels after radical prostatectomy. The AUA defines biochemical recurrence as an initial PSA value 0.2 ng/mL or greater followed by a subsequent confirmatory PSA value 0.2 ng/mL or greater. Values obtained with different assay methods or kits cannot be used interchangeably. Results cannot be interpreted as absolute evidence of the presence or absence of malignant disease.           Passed - Last BP in normal range  BP Readings from Last 1 Encounters:  12/05/23 122/76         Passed - Valid encounter within last 12 months    Recent Outpatient Visits           2 months ago Type 2 diabetes mellitus with hyperglycemia, without long-term current use of insulin (HCC)   [place] [facility] at [facility], Toribio SQUIBB, [place]   3 months ago Sebaceous cyst of right axilla   [facility] Primary Care & Sports Medicine at [facility], Toribio SQUIBB, [place]   4 months ago Sebaceous cyst of right axilla   [facility] Primary Care & Sports Medicine at [facility], Toribio SQUIBB, [place]   6 months ago Type 2 diabetes mellitus with hyperglycemia, without long-term current use of insulin [facility])   [place] [facility] at [facility], Toribio SQUIBB, [place]   9 months ago Type 2 diabetes mellitus with hyperglycemia, without long-term current use of insulin [facility])   [place] [facility] Medicine at [facility], Toribio SQUIBB, [place]               Accu-Chek Softclix Lancets lancets 200 each 3    Sig: Use to check blood sugar up to twice daily     Endocrinology: Diabetes - Testing Supplies Passed - 02/16/2024  3:58 PM      Passed - Valid encounter within last 12 months    Recent Outpatient Visits           2 months ago Type 2 diabetes mellitus with hyperglycemia, without long-term current use of insulin (HCC)   [place] [facility] at [facility], Toribio SQUIBB, [place]   3 months ago Sebaceous cyst of right axilla   [place] [facility] at [facility], Toribio SQUIBB, [place]   4 months ago Sebaceous cyst of right axilla   [place] [facility] Medicine at [facility], Toribio SQUIBB, [place]   6 months ago Type 2 diabetes mellitus with hyperglycemia, without long-term current use of insulin (HCC)   [place] [facility]  Medicine at [facility], Toribio SQUIBB, [place]   9 months ago Type 2 diabetes mellitus with hyperglycemia, without long-term current use of insulin [facility])   [place] [facility] at [facility], Daniel P, [place]               glucose blood (ACCU-CHEK GUIDE TEST) test strip 200 each 3    Sig: Use to check blood sugar up to twice daily     There is no refill protocol information for this order

## 2024-02-16 NOTE — Telephone Encounter (Signed)
 Requested Prescriptions  Pending Prescriptions Disp Refills   sildenafil  (VIAGRA ) 50 MG tablet 10 tablet 0    Sig: Take 0.5-1 tablets (25-50 mg total) by mouth daily as needed for erectile dysfunction. Do not take more than 1 tablet within 24h.     Urology: Erectile Dysfunction Agents Failed - 02/16/2024  3:57 PM      Failed - AST in normal range and within 360 days    AST  Date Value Ref Range Status  01/08/2023 37 0 - 40 IU/L Final   SGOT(AST)  Date Value Ref Range Status  05/04/2012 20 15 - 37 Unit/L Final         Failed - ALT in normal range and within 360 days    ALT  Date Value Ref Range Status  01/08/2023 37 0 - 44 IU/L Final   SGPT (ALT)  Date Value Ref Range Status  05/04/2012 25 12 - 78 U/L Final         Passed - Last BP in normal range    BP Readings from Last 1 Encounters:  12/05/23 122/76         Passed - Valid encounter within last 12 months    Recent Outpatient Visits           2 months ago Type 2 diabetes mellitus with hyperglycemia, without long-term current use of insulin (HCC)   [place] [facility], [place]   3 months ago Sebaceous cyst of right axilla   [facility] at [facility], [place]   4 months ago Sebaceous cyst of right axilla   [facility] at [facility], [place]   6 months ago Type 2 diabetes mellitus with hyperglycemia, without long-term current use of insulin [facility])   [place] [facility] at [facility], [place]   9 months ago Type 2 diabetes mellitus with hyperglycemia, without long-term current use of insulin [facility])   [facility] at [facility], [place], [place]              Refused Prescriptions Disp Refills   mirtazapine  (REMERON ) 7.5 MG tablet 90 tablet 1     Psychiatry: Antidepressants -  mirtazapine  Passed - 02/16/2024  3:57 PM      Passed - Valid encounter within last 6 months    Recent Outpatient Visits           2 months ago Type 2 diabetes mellitus with hyperglycemia, without long-term current use of insulin (HCC)   [place] [facility], [place]   3 months ago Sebaceous cyst of right axilla   [facility] at [facility], [place]   4 months ago Sebaceous cyst of right axilla   [facility] at [facility], [place]   6 months ago Type 2 diabetes mellitus with hyperglycemia, without long-term current use of insulin [facility])   [place] [facility] at [facility], [place]   9 months ago Type 2 diabetes mellitus with hyperglycemia, without long-term current use of insulin [facility])   [facility] at [facility], [place], [place]  rosuvastatin  (CRESTOR ) 5 MG tablet 90 tablet 3    Sig: Take 1 tablet (5 mg total) by mouth daily.     Cardiovascular:  Antilipid - Statins 2 Failed - 02/16/2024  3:57 PM      Failed - Lipid Panel in normal range within the last 12 months    Cholesterol, Total  Date Value Ref Range Status  05/13/2023 197 100 - 199 mg/dL Final   LDL Chol Calc (NIH)  Date Value Ref Range Status  05/13/2023 121 (H) 0 - 99 mg/dL Final   HDL  Date Value Ref Range Status  05/13/2023 61 >39 mg/dL Final   Triglycerides  Date Value Ref Range Status  05/13/2023 85 0 - 149 mg/dL Final         Passed - Cr in normal range and within 360 days    Creatinine  Date Value Ref Range Status  05/04/2012 1.09 0.60 - 1.30 mg/dL Final   Creatinine, Ser  Date Value Ref Range Status  05/13/2023 1.19 0.76 - 1.27 mg/dL Final         Passed - Patient is not pregnant      Passed - Valid encounter within last 12 months    Recent  Outpatient Visits           2 months ago Type 2 diabetes mellitus with hyperglycemia, without long-term current use of insulin (HCC)   [place] [facility], [place]   3 months ago Sebaceous cyst of right axilla   [facility] at [facility], [place]   4 months ago Sebaceous cyst of right axilla   [facility] at [facility], [place]   6 months ago Type 2 diabetes mellitus with hyperglycemia, without long-term current use of insulin [facility])   [place] [facility] at [facility], [place]   9 months ago Type 2 diabetes mellitus with hyperglycemia, without long-term current use of insulin [facility])   [facility] at [facility], [place], [place]               tamsulosin  (FLOMAX ) 0.4 MG CAPS capsule 90 capsule 0     Urology: Alpha-Adrenergic Blocker Failed - 02/16/2024  3:57 PM      Failed - PSA in normal range and within 360 days    Prostate Specific Ag, Serum  Date Value Ref Range Status  05/13/2023 5.5 (H) 0.0 - 4.0 ng/mL Final    Comment:    Roche ECLIA methodology. According to the American Urological Association, Serum PSA should decrease and remain at undetectable levels after radical prostatectomy. The AUA defines biochemical recurrence as an initial PSA value 0.2 ng/mL or greater followed by a subsequent confirmatory PSA value 0.2 ng/mL or greater. Values obtained with different assay methods or kits cannot be used interchangeably. Results cannot be interpreted as absolute evidence of the presence or absence of malignant disease.          Passed - Last BP in normal range    BP Readings from Last 1 Encounters:  12/05/23 122/76         Passed - Valid encounter within last 12 months    Recent Outpatient Visits           2 months ago Type 2  diabetes mellitus with hyperglycemia, without  long-term current use of insulin [facility])   [place] [facility] at [facility], [place], [place]   3 months ago Sebaceous cyst of right axilla   [facility] at [facility], [place]   4 months ago Sebaceous cyst of right axilla   [facility] at [facility], [place]   6 months ago Type 2 diabetes mellitus with hyperglycemia, without long-term current use of insulin [facility])   [place] [facility] at [facility], [place]   9 months ago Type 2 diabetes mellitus with hyperglycemia, without long-term current use of insulin [facility])   [facility] at [facility], [place]               Accu-Chek Softclix Lancets lancets 200 each 3    Sig: Use to check blood sugar up to twice daily     Endocrinology: Diabetes - Testing Supplies Passed - 02/16/2024  3:57 PM      Passed - Valid encounter within last 12 months    Recent Outpatient Visits           2 months ago Type 2 diabetes mellitus with hyperglycemia, without long-term current use of insulin (HCC)   [place] [facility], [place]   3 months ago Sebaceous cyst of right axilla   [place] [facility] at [facility], [place]   4 months ago Sebaceous cyst of right axilla   [place] [facility] at [facility], [place]   6 months ago Type 2 diabetes mellitus with hyperglycemia, without long-term current use of insulin (HCC)   [place] [facility] at [facility], [place]   9 months ago Type 2 diabetes mellitus with hyperglycemia, without long-term current use of insulin [facility])   [place] [facility] at  [facility], [place]               glucose blood (ACCU-CHEK GUIDE TEST) test strip 200 each 3    Sig: Use to check blood sugar up to twice daily     There is no refill protocol information for this order

## 2024-03-08 ENCOUNTER — Ambulatory Visit: Admitting: Physician Assistant

## 2024-07-14 ENCOUNTER — Ambulatory Visit
